# Patient Record
Sex: Female | Born: 1985 | Race: White | Hispanic: No | Marital: Married | Smoking: Never smoker
Health system: Southern US, Community
[De-identification: ages and names within clinical notes are randomized; demographics above are authoritative.]

## PROBLEM LIST (undated history)

## (undated) DIAGNOSIS — E039 Hypothyroidism, unspecified: Secondary | ICD-10-CM

## (undated) DIAGNOSIS — R112 Nausea with vomiting, unspecified: Secondary | ICD-10-CM

## (undated) DIAGNOSIS — Z9889 Other specified postprocedural states: Secondary | ICD-10-CM

## (undated) DIAGNOSIS — F32A Depression, unspecified: Secondary | ICD-10-CM

## (undated) DIAGNOSIS — K219 Gastro-esophageal reflux disease without esophagitis: Secondary | ICD-10-CM

## (undated) DIAGNOSIS — F419 Anxiety disorder, unspecified: Secondary | ICD-10-CM

## (undated) DIAGNOSIS — Z8742 Personal history of other diseases of the female genital tract: Secondary | ICD-10-CM

## (undated) HISTORY — PX: KNEE ARTHROSCOPY: SUR90

## (undated) HISTORY — PX: CYSTOURETHROSCOPY: SHX476

## (undated) HISTORY — PX: ABDOMINAL HYSTERECTOMY: SHX81

---

## 2006-12-19 ENCOUNTER — Ambulatory Visit: Payer: Self-pay | Admitting: Internal Medicine
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
## 2013-12-03 ENCOUNTER — Ambulatory Visit: Payer: Self-pay | Admitting: Family Medicine

## 2013-12-03 LAB — URINALYSIS, COMPLETE
BILIRUBIN, UR: NEGATIVE
BLOOD: NEGATIVE
GLUCOSE, UR: NEGATIVE
KETONE: NEGATIVE
LEUKOCYTE ESTERASE: NEGATIVE
Nitrite: NEGATIVE
Ph: 5.5 (ref 5.0–8.0)
Protein: NEGATIVE
SPECIFIC GRAVITY: 1.02 (ref 1.000–1.030)

## 2013-12-05 LAB — URINE CULTURE

## 2021-03-19 ENCOUNTER — Ambulatory Visit: Payer: Self-pay | Admitting: Family Medicine

## 2021-09-18 ENCOUNTER — Ambulatory Visit: Payer: Self-pay | Admitting: Surgery

## 2021-09-18 NOTE — H&P (Signed)
History of Present Illness: Cheryl Gentry is a 36 y.o. female who was referred to me for evaluation of abdominal pain. Since December of 2022, she has been having pain in the RUQ with radiation to her back. It is now almost constant, and gets worse after eating. She had a more severe episode about 2 weeks ago. She recently moved to the area from [place], [place]. She had a RUQ US done at [facility] in [place], which per report showed mild gallbladder wall thickening and stones within the gallbladder. We do not have the images available. She says she was told at that time that she need a cholecystectomy, however was not able to get the surgery approved electively as she does not have insurance. She has continued to have symptoms. She previously had LFTs done by her PCP which were normal.   Her only prior abdominal surgery is a hysterectomy.     Review of Systems: A complete review of systems was obtained from the patient.  I have reviewed this information and discussed as appropriate with the patient.  See HPI as well for other ROS.       Medical History: Past Medical History Past Medical History: Diagnosis Date  Allergic state     iodine shellfish  Anxiety    Hypertension    Thyroid disease        Patient Active Problem List Diagnosis  Thyroid disease     Past Surgical History Past Surgical History: Procedure Laterality Date  HYSTERECTOMY   2015  KNEE ARTHROSCOPY       both knees      Allergies Allergies Allergen Reactions  Iodine Hives  Shellfish Containing Products Hives and Swelling      Current Outpatient Medications on File Prior to Visit Medication Sig Dispense Refill  ALPRAZolam (XANAX) 0.25 MG tablet Take by mouth      levothyroxine (SYNTHROID, LEVOTHROID) 25 MCG tablet TAKE 1 TABLET BY MOUTH EVERY DAY ON AN EMPTY STOMACH. PT NEEDS APPT FOR FURTHER REFILLS. 30 tablet 0  metFORMIN (GLUCOPHAGE) 500 MG tablet TAKE 1 TABLET BY MOUTH TWICE DAILY AS DIRECTED       medroxyPROGESTERone (PROVERA) 10 MG tablet Take 10 mg by mouth once daily. Patient takes monthly for 10 days. (Patient not taking: Reported on 09/18/2021)      phentermine (ADIPEX-P) 37.5 MG capsule Take 37.5 mg by mouth 2 (two) times daily. (Patient not taking: Reported on 09/18/2021)       No current facility-administered medications on file prior to visit.     Family HistoryExpand by Default Family History Problem Relation Age of Onset  Skin cancer Mother    Obesity Mother    High blood pressure (Hypertension) Mother    Hyperlipidemia (Elevated cholesterol) Mother    Depression Mother    Skin cancer Father    Hyperlipidemia (Elevated cholesterol) Father    Deep vein thrombosis (DVT or abnormal blood clot formation) Father    High blood pressure (Hypertension) Father    Coronary Artery Disease (Blocked arteries around heart) Father 59  Obesity Sister    High blood pressure (Hypertension) Brother    Depression Brother    Bipolar disorder Brother    Depression Brother    High blood pressure (Hypertension) Brother    Thyroid disease Maternal Grandmother    Diabetes type II Maternal Grandfather    Stroke Paternal Grandmother 19  Colon cancer Paternal Grandmother    Coronary Artery Disease (Blocked arteries around heart) Paternal Grandfather 57  Social History   Tobacco Use Smoking Status Never Smokeless Tobacco Never     Social History Social History    Socioeconomic History  Marital status: Married Tobacco Use  Smoking status: Never  Smokeless tobacco: Never Substance and Sexual Activity  Alcohol use: No     Comment: rarely  Drug use: No  Sexual activity: Yes     Partners: Male     Birth control/protection: None Other Topics Concern  Would you please tell us about the people who live in your home, your pets, or anything else important to your social life? Yes     Comment: my husband mother in law 2 cats Social History Narrative   Married, lives with  husband. Some college. Works as Geologist, engineering at [employer]. Goes to [employer] five days a week. Thirty - 60 minutes a day there. STill pursuing a divinity degree - only has two classes left.       Objective:     Vitals:   09/18/21 1512 BP: 110/70 Pulse: 73 Weight: (!) 123.7 kg (272 lb 9.6 oz) Height: 163.8 cm (5' 4.5")   Body mass index is 46.07 kg/m.   Physical Exam Vitals reviewed.  Constitutional:      General: She is not in acute distress.    Appearance: Normal appearance.  HENT:     Head: Normocephalic and atraumatic.  Eyes:     General: No scleral icterus.    Conjunctiva/sclera: Conjunctivae normal.  Cardiovascular:     Rate and Rhythm: Normal rate and regular rhythm.     Heart sounds: No murmur heard. Pulmonary:     Effort: Pulmonary effort is normal. No respiratory distress.     Breath sounds: Normal breath sounds. No wheezing.  Abdominal:     General: There is no distension.     Palpations: Abdomen is soft.     Tenderness: There is no abdominal tenderness.     Comments: Mild RUQ tenderness to palpation. No upper abdominal surgical scars.  Musculoskeletal:        General: Normal range of motion.  Skin:    General: Skin is warm and dry.     Coloration: Skin is not jaundiced.  Neurological:     General: No focal deficit present.     Mental Status: She is alert and oriented to person, place, and time.  Psychiatric:        Mood and Affect: Mood normal.        Behavior: Behavior normal.        Thought Content: Thought content normal.          Assessment and Plan: Diagnoses and all orders for this visit:   Symptomatic cholelithiasis     This is a 36 yo female referred for evaluation of symptomatic cholelithiasis. Her symptoms are consistent with biliary colic, and she has documentation of gallstones on outside imaging. We have requested the actual images from [facility]. If unable to obtain, will need to repeat her [place] here in [place]. In  the meantime we will begin the scheduling process for surgery. Laparoscopic cholecystectomy was recommended. The details of this procedure were discussed with the patient, including the risks of bleeding, infection, bile leak, and <0.5% risk of common bile duct injury. The patient expressed understanding and agrees to proceed with surgery. She will be contacted to schedule an elective surgery date.  Sophronia Simas, MD [facility] General, Hepatobiliary and Pancreatic Surgery 09/18/21 3:54 PM

## 2021-10-16 ENCOUNTER — Encounter (HOSPITAL_COMMUNITY): Payer: Self-pay

## 2021-10-16 NOTE — Pre-Procedure Instructions (Signed)
Surgical Instructions    Your procedure is scheduled on Monday, October 9th.  Report to [facility] Main Entrance "A" at 05:30 A.M., then check in with the Admitting office.  Call this number if you have problems the morning of surgery:  [phone]   If you have any questions prior to your surgery date call [phone]: Open Monday-Friday 8am-4pm    Remember:  Do not eat after midnight the night before your surgery  You may drink clear liquids until 04:30 AM the morning of your surgery.   Clear liquids allowed are: Water, Non-Citrus Juices (without pulp), Carbonated Beverages, Clear Tea, Black Coffee Only (NO MILK, CREAM OR POWDERED CREAMER of any kind), and Gatorade.    Take these medicines the morning of surgery with A SIP OF WATER  escitalopram (LEXAPRO)  levothyroxine (SYNTHROID)  omeprazole (PRILOSEC OTC)    If needed: ALPRAZolam Prudy Feeler)  As of today, STOP taking any Aspirin (unless otherwise instructed by your surgeon) Aleve, Naproxen, Ibuprofen, Motrin, Advil, Goody's, BC's, all herbal medications, fish oil, and all vitamins.  WHAT DO I DO ABOUT MY DIABETES MEDICATION?   Do not take metFORMIN (GLUCOPHAGE)  the morning of surgery.    HOW TO MANAGE YOUR DIABETES BEFORE AND AFTER SURGERY  Why is it important to control my blood sugar before and after surgery? Improving blood sugar levels before and after surgery helps healing and can limit problems. A way of improving blood sugar control is eating a healthy diet by:  Eating less sugar and carbohydrates  Increasing activity/exercise  Talking with your doctor about reaching your blood sugar goals High blood sugars (greater than 180 mg/dL) can raise your risk of infections and slow your recovery, so you will need to focus on controlling your diabetes during the weeks before surgery. Make sure that the doctor who takes care of your diabetes knows about your planned surgery including the date and location.  How do I  manage my blood sugar before surgery? Check your blood sugar at least 4 times a day, starting 2 days before surgery, to make sure that the level is not too high or low.  Check your blood sugar the morning of your surgery when you wake up and every 2 hours until you get to the Short Stay unit.  If your blood sugar is less than 70 mg/dL, you will need to treat for low blood sugar: Do not take insulin. Treat a low blood sugar (less than 70 mg/dL) with  cup of clear juice (cranberry or apple), 4 glucose tablets, OR glucose gel. Recheck blood sugar in 15 minutes after treatment (to make sure it is greater than 70 mg/dL). If your blood sugar is not greater than 70 mg/dL on recheck, call [phone] for further instructions. Report your blood sugar to the short stay nurse when you get to Short Stay.  If you are admitted to the hospital after surgery: Your blood sugar will be checked by the staff and you will probably be given insulin after surgery (instead of oral diabetes medicines) to make sure you have good blood sugar levels. The goal for blood sugar control after surgery is 80-180 mg/dL.                     Do NOT Smoke (Tobacco/Vaping) for 24 hours prior to your procedure.  If you use a CPAP at night, you may bring your mask/headgear for your overnight stay.   Contacts, glasses, piercing's, hearing aid's, dentures or partials may  not be worn into surgery, please bring cases for these belongings.    For patients admitted to the hospital, discharge time will be determined by your treatment team.   Patients discharged the day of surgery will not be allowed to drive home, and someone needs to stay with them for 24 hours.  SURGICAL WAITING ROOM VISITATION Patients having surgery or a procedure may have no more than 2 support people in the waiting area - these visitors may rotate.   Children under the age of 40 must have an adult with them who is not the patient. If the patient needs to stay  at the hospital during part of their recovery, the visitor guidelines for inpatient rooms apply. Pre-op nurse will coordinate an appropriate time for 1 support person to accompany patient in pre-op.  This support person may not rotate.   Please refer to the [facility] website for the visitor guidelines for Inpatients (after your surgery is over and you are in a regular room).    Special instructions:   [place]- Preparing For Surgery  Before surgery, you can play an important role. Because skin is not sterile, your skin needs to be as free of germs as possible. You can reduce the number of germs on your skin by washing with CHG (chlorahexidine gluconate) Soap before surgery.  CHG is an antiseptic cleaner which kills germs and bonds with the skin to continue killing germs even after washing.    Oral Hygiene is also important to reduce your risk of infection.  Remember - BRUSH YOUR TEETH THE MORNING OF SURGERY WITH YOUR REGULAR TOOTHPASTE  Please do not use if you have an allergy to CHG or antibacterial soaps. If your skin becomes reddened/irritated stop using the CHG.  Do not shave (including legs and underarms) for at least 48 hours prior to first CHG shower. It is OK to shave your face.  Please follow these instructions carefully.   Shower the NIGHT BEFORE SURGERY and the MORNING OF SURGERY  If you chose to wash your hair, wash your hair first as usual with your normal shampoo.  After you shampoo, rinse your hair and body thoroughly to remove the shampoo.  Use CHG Soap as you would any other liquid soap. You can apply CHG directly to the skin and wash gently with a scrungie or a clean washcloth.   Apply the CHG Soap to your body ONLY FROM THE NECK DOWN.  Do not use on open wounds or open sores. Avoid contact with your eyes, ears, mouth and genitals (private parts). Wash Face and genitals (private parts)  with your normal soap.   Wash thoroughly, paying special attention to the area  where your surgery will be performed.  Thoroughly rinse your body with warm water from the neck down.  DO NOT shower/wash with your normal soap after using and rinsing off the CHG Soap.  Pat yourself dry with a CLEAN TOWEL.  Wear CLEAN PAJAMAS to bed the night before surgery  Place CLEAN SHEETS on your bed the night before your surgery  DO NOT SLEEP WITH PETS.   Day of Surgery: Take a shower with CHG soap. Do not wear jewelry or makeup Do not wear lotions, powders, perfumes, or deodorant. Do not shave 48 hours prior to surgery.   Do not bring valuables to the hospital. [facility] is not responsible for any belongings or valuables. Do not wear nail polish, gel polish, artificial nails, or any other type of covering on  natural nails (fingers and toes) If you have artificial nails or gel coating that need to be removed by a nail salon, please have this removed prior to surgery. Artificial nails or gel coating may interfere with anesthesia's ability to adequately monitor your vital signs. Wear Clean/Comfortable clothing the morning of surgery Remember to brush your teeth WITH YOUR REGULAR TOOTHPASTE.   Please read over the following fact sheets that you were given.    If you received a COVID test during your pre-op visit  it is requested that you wear a mask when out in public, stay away from anyone that may not be feeling well and notify your surgeon if you develop symptoms. If you have been in contact with anyone that has tested positive in the last 10 days please notify you surgeon.

## 2021-10-19 ENCOUNTER — Encounter (HOSPITAL_COMMUNITY)
Admission: RE | Admit: 2021-10-19 | Discharge: 2021-10-19 | Disposition: A | Discharge status: Home / Self Care | Payer: BC Managed Care – PPO | Source: Ambulatory Visit | Attending: Surgery | Admitting: Surgery

## 2021-10-19 ENCOUNTER — Other Ambulatory Visit: Payer: Self-pay

## 2021-10-19 ENCOUNTER — Encounter (HOSPITAL_COMMUNITY): Payer: Self-pay

## 2021-10-19 VITALS — BP 122/81 | HR 83 | Temp 98.6°F | Resp 17 | Ht 64.0 in | Wt 271.6 lb

## 2021-10-19 DIAGNOSIS — Z01818 Encounter for other preprocedural examination: Secondary | ICD-10-CM

## 2021-10-19 DIAGNOSIS — Z01812 Encounter for preprocedural laboratory examination: Secondary | ICD-10-CM | POA: Diagnosis present

## 2021-10-19 HISTORY — DX: Depression, unspecified: F32.A

## 2021-10-19 HISTORY — DX: Personal history of other diseases of the female genital tract: Z87.42

## 2021-10-19 HISTORY — DX: Other specified postprocedural states: Z98.890

## 2021-10-19 HISTORY — DX: Other specified postprocedural states: R11.2

## 2021-10-19 HISTORY — DX: Hypothyroidism, unspecified: E03.9

## 2021-10-19 HISTORY — DX: Anxiety disorder, unspecified: F41.9

## 2021-10-19 HISTORY — DX: Gastro-esophageal reflux disease without esophagitis: K21.9

## 2021-10-19 LAB — CBC
HCT: 38.5 % (ref 36.0–46.0)
Hemoglobin: 12.8 g/dL (ref 12.0–15.0)
MCH: 31.5 pg (ref 26.0–34.0)
MCHC: 33.2 g/dL (ref 30.0–36.0)
MCV: 94.8 fL (ref 80.0–100.0)
Platelets: 170 10*3/uL (ref 150–400)
RBC: 4.06 MIL/uL (ref 3.87–5.11)
RDW: 12.3 % (ref 11.5–15.5)
WBC: 4.6 10*3/uL (ref 4.0–10.5)
nRBC: 0 % (ref 0.0–0.2)

## 2021-10-19 NOTE — Progress Notes (Signed)
PCP - Verita Lamb, NP Cardiologist - denies  PPM/ICD - denies   Chest x-ray - 07/11/15 EKG - 07/10/15 Stress Test - denies ECHO - denies Cardiac Cath - denies  Sleep Study - denies   DM- denies  ASA/Blood Thinner Instructions: n/a  ERAS Protcol - yes, no drink   COVID TEST- n/a   Anesthesia review: no  Patient denies shortness of breath, fever, cough and chest pain at PAT appointment   All instructions explained to the patient, with a verbal understanding of the material. Patient agrees to go over the instructions while at home for a better understanding. The opportunity to ask questions was provided.

## 2021-10-25 NOTE — Anesthesia Preprocedure Evaluation (Signed)
Anesthesia Evaluation  Patient identified by MRN, date of birth, ID band Patient awake    Reviewed: Allergy & Precautions, NPO status , Patient's Chart, lab work & pertinent test results  History of Anesthesia Complications (+) PONV and history of anesthetic complications  Airway Mallampati: II       Dental no notable dental hx.    Pulmonary neg pulmonary ROS,    Pulmonary exam normal        Cardiovascular negative cardio ROS Normal cardiovascular exam     Neuro/Psych PSYCHIATRIC DISORDERS Anxiety Depression negative neurological ROS     GI/Hepatic GERD  Medicated,  Endo/Other  diabetes, Type 2, Oral Hypoglycemic AgentsHypothyroidism Morbid obesity  Renal/GU   negative genitourinary   Musculoskeletal negative musculoskeletal ROS (+)   Abdominal (+) + obese,   Peds negative pediatric ROS (+)  Hematology   Anesthesia Other Findings   Reproductive/Obstetrics                            Anesthesia Physical Anesthesia Plan  ASA: 3  Anesthesia Plan: General   Post-op Pain Management: Dilaudid IV   Induction: Intravenous  PONV Risk Score and Plan: 4 or greater and Ondansetron, Midazolam, Scopolamine patch - Pre-op, Promethazine, Amisulpride and Treatment may vary due to age or medical condition  Airway Management Planned: Oral ETT  Additional Equipment: None  Intra-op Plan:   Post-operative Plan: Extubation in OR  Informed Consent: I have reviewed the patients History and Physical, chart, labs and discussed the procedure including the risks, benefits and alternatives for the proposed anesthesia with the patient or authorized representative who has indicated his/her understanding and acceptance.     Dental advisory given  Plan Discussed with: CRNA  Anesthesia Plan Comments:        Anesthesia Quick Evaluation

## 2021-10-26 ENCOUNTER — Ambulatory Visit (HOSPITAL_COMMUNITY)
Admission: RE | Admit: 2021-10-26 | Discharge: 2021-10-26 | Disposition: A | Discharge status: Home / Self Care | Payer: BC Managed Care – PPO | Attending: Surgery | Admitting: Surgery

## 2021-10-26 ENCOUNTER — Encounter (HOSPITAL_COMMUNITY)
Admission: RE | Disposition: A | Discharge status: Home / Self Care | Payer: Self-pay | Source: Home / Self Care | Attending: Surgery

## 2021-10-26 ENCOUNTER — Encounter (HOSPITAL_COMMUNITY): Payer: Self-pay | Admitting: Surgery

## 2021-10-26 ENCOUNTER — Other Ambulatory Visit: Payer: Self-pay

## 2021-10-26 ENCOUNTER — Ambulatory Visit (HOSPITAL_COMMUNITY): Payer: BC Managed Care – PPO | Admitting: Anesthesiology

## 2021-10-26 DIAGNOSIS — F32A Depression, unspecified: Secondary | ICD-10-CM | POA: Insufficient documentation

## 2021-10-26 DIAGNOSIS — Z90710 Acquired absence of both cervix and uterus: Secondary | ICD-10-CM | POA: Insufficient documentation

## 2021-10-26 DIAGNOSIS — Z7984 Long term (current) use of oral hypoglycemic drugs: Secondary | ICD-10-CM | POA: Diagnosis not present

## 2021-10-26 DIAGNOSIS — K8010 Calculus of gallbladder with chronic cholecystitis without obstruction: Secondary | ICD-10-CM | POA: Diagnosis present

## 2021-10-26 DIAGNOSIS — F419 Anxiety disorder, unspecified: Secondary | ICD-10-CM | POA: Diagnosis not present

## 2021-10-26 DIAGNOSIS — K219 Gastro-esophageal reflux disease without esophagitis: Secondary | ICD-10-CM | POA: Diagnosis not present

## 2021-10-26 DIAGNOSIS — Z6841 Body Mass Index (BMI) 40.0 and over, adult: Secondary | ICD-10-CM | POA: Insufficient documentation

## 2021-10-26 DIAGNOSIS — E119 Type 2 diabetes mellitus without complications: Secondary | ICD-10-CM | POA: Diagnosis not present

## 2021-10-26 HISTORY — PX: CHOLECYSTECTOMY: SHX55

## 2021-10-26 SURGERY — LAPAROSCOPIC CHOLECYSTECTOMY
Anesthesia: General | Site: Abdomen

## 2021-10-26 MED ORDER — DEXAMETHASONE SODIUM PHOSPHATE 10 MG/ML IJ SOLN
INTRAMUSCULAR | Status: DC | PRN
  Administered 2021-10-26: 10 mg via INTRAVENOUS

## 2021-10-26 MED ORDER — OXYCODONE HCL 5 MG/5ML PO SOLN: 5.0000 mg | Freq: Once | ORAL | Status: DC | PRN

## 2021-10-26 MED ORDER — DEXAMETHASONE SODIUM PHOSPHATE 10 MG/ML IJ SOLN
INTRAMUSCULAR | Status: AC
  Filled 2021-10-26: qty 1

## 2021-10-26 MED ORDER — ONDANSETRON HCL 4 MG/2ML IJ SOLN
INTRAMUSCULAR | Status: DC | PRN
  Administered 2021-10-26: 4 mg via INTRAVENOUS

## 2021-10-26 MED ORDER — HYDROCODONE-ACETAMINOPHEN 5-325 MG PO TABS: 1.0000 | ORAL_TABLET | Freq: Four times a day (QID) | ORAL | 0 refills | Status: AC | PRN

## 2021-10-26 MED ORDER — MIDAZOLAM HCL 5 MG/5ML IJ SOLN
INTRAMUSCULAR | Status: DC | PRN
  Administered 2021-10-26: 2 mg via INTRAVENOUS

## 2021-10-26 MED ORDER — FENTANYL CITRATE (PF) 100 MCG/2ML IJ SOLN
INTRAMUSCULAR | Status: DC | PRN
  Administered 2021-10-26 (×4): 50 ug via INTRAVENOUS

## 2021-10-26 MED ORDER — ONDANSETRON HCL 4 MG/2ML IJ SOLN: 4.0000 mg | Freq: Once | INTRAMUSCULAR | Status: DC | PRN

## 2021-10-26 MED ORDER — KETOROLAC TROMETHAMINE 30 MG/ML IJ SOLN
30.0000 mg | Freq: Once | INTRAMUSCULAR | Status: AC | PRN
  Administered 2021-10-26: 30 mg via INTRAVENOUS

## 2021-10-26 MED ORDER — BUPIVACAINE-EPINEPHRINE 0.25% -1:200000 IJ SOLN
INTRAMUSCULAR | Status: DC | PRN
  Administered 2021-10-26: 25 mL

## 2021-10-26 MED ORDER — SUGAMMADEX SODIUM 500 MG/5ML IV SOLN
INTRAVENOUS | Status: AC
  Filled 2021-10-26: qty 5

## 2021-10-26 MED ORDER — ACETAMINOPHEN 160 MG/5ML PO SOLN: 325.0000 mg | ORAL | Status: DC | PRN

## 2021-10-26 MED ORDER — BUPIVACAINE-EPINEPHRINE (PF) 0.25% -1:200000 IJ SOLN
INTRAMUSCULAR | Status: AC
  Filled 2021-10-26: qty 30

## 2021-10-26 MED ORDER — KETOROLAC TROMETHAMINE 30 MG/ML IJ SOLN
INTRAMUSCULAR | Status: AC
  Filled 2021-10-26: qty 1

## 2021-10-26 MED ORDER — ACETAMINOPHEN 500 MG PO TABS
1000.0000 mg | ORAL_TABLET | ORAL | Status: AC
  Administered 2021-10-26: 1000 mg via ORAL
  Filled 2021-10-26: qty 2

## 2021-10-26 MED ORDER — ROCURONIUM BROMIDE 100 MG/10ML IV SOLN
INTRAVENOUS | Status: DC | PRN
  Administered 2021-10-26: 60 mg via INTRAVENOUS

## 2021-10-26 MED ORDER — ATROPINE SULFATE 0.4 MG/ML IV SOLN
INTRAVENOUS | Status: AC
  Filled 2021-10-26: qty 1

## 2021-10-26 MED ORDER — LIDOCAINE 2% (20 MG/ML) 5 ML SYRINGE
INTRAMUSCULAR | Status: AC
  Filled 2021-10-26: qty 5

## 2021-10-26 MED ORDER — MIDAZOLAM HCL 2 MG/2ML IJ SOLN
INTRAMUSCULAR | Status: AC
  Filled 2021-10-26: qty 2

## 2021-10-26 MED ORDER — SODIUM CHLORIDE 0.9 % IR SOLN
Status: DC | PRN
  Administered 2021-10-26: 1000 mL

## 2021-10-26 MED ORDER — ROCURONIUM BROMIDE 10 MG/ML (PF) SYRINGE
PREFILLED_SYRINGE | INTRAVENOUS | Status: AC
  Filled 2021-10-26: qty 10

## 2021-10-26 MED ORDER — PROPOFOL 10 MG/ML IV BOLUS
INTRAVENOUS | Status: AC
  Filled 2021-10-26: qty 20

## 2021-10-26 MED ORDER — LACTATED RINGERS IV SOLN: INTRAVENOUS | Status: DC

## 2021-10-26 MED ORDER — ACETAMINOPHEN 325 MG PO TABS: 325.0000 mg | ORAL_TABLET | ORAL | Status: DC | PRN

## 2021-10-26 MED ORDER — PROPOFOL 10 MG/ML IV BOLUS
INTRAVENOUS | Status: DC | PRN
  Administered 2021-10-26: 200 mg via INTRAVENOUS

## 2021-10-26 MED ORDER — FENTANYL CITRATE (PF) 250 MCG/5ML IJ SOLN
INTRAMUSCULAR | Status: AC
  Filled 2021-10-26: qty 5

## 2021-10-26 MED ORDER — ORAL CARE MOUTH RINSE: 15.0000 mL | Freq: Once | OROMUCOSAL | Status: AC

## 2021-10-26 MED ORDER — ONDANSETRON HCL 4 MG/2ML IJ SOLN
INTRAMUSCULAR | Status: AC
  Filled 2021-10-26: qty 2

## 2021-10-26 MED ORDER — BUPIVACAINE HCL (PF) 0.25 % IJ SOLN
INTRAMUSCULAR | Status: AC
  Filled 2021-10-26: qty 30

## 2021-10-26 MED ORDER — CEFAZOLIN IN SODIUM CHLORIDE 3-0.9 GM/100ML-% IV SOLN
3.0000 g | INTRAVENOUS | Status: AC
  Administered 2021-10-26: 3 g via INTRAVENOUS
  Filled 2021-10-26: qty 100

## 2021-10-26 MED ORDER — MEPERIDINE HCL 25 MG/ML IJ SOLN: 6.2500 mg | INTRAMUSCULAR | Status: DC | PRN

## 2021-10-26 MED ORDER — PHENYLEPHRINE HCL (PRESSORS) 10 MG/ML IV SOLN
INTRAVENOUS | Status: DC | PRN
  Administered 2021-10-26 (×2): 80 ug via INTRAVENOUS

## 2021-10-26 MED ORDER — CHLORHEXIDINE GLUCONATE 0.12 % MT SOLN
15.0000 mL | Freq: Once | OROMUCOSAL | Status: AC
  Administered 2021-10-26: 15 mL via OROMUCOSAL
  Filled 2021-10-26: qty 15

## 2021-10-26 MED ORDER — LIDOCAINE 2% (20 MG/ML) 5 ML SYRINGE
INTRAMUSCULAR | Status: DC | PRN
  Administered 2021-10-26: 100 mg via INTRAVENOUS

## 2021-10-26 MED ORDER — GABAPENTIN 300 MG PO CAPS
300.0000 mg | ORAL_CAPSULE | ORAL | Status: AC
  Administered 2021-10-26: 300 mg via ORAL
  Filled 2021-10-26: qty 1

## 2021-10-26 MED ORDER — PHENYLEPHRINE 80 MCG/ML (10ML) SYRINGE FOR IV PUSH (FOR BLOOD PRESSURE SUPPORT)
PREFILLED_SYRINGE | INTRAVENOUS | Status: AC
  Filled 2021-10-26: qty 10

## 2021-10-26 MED ORDER — OXYCODONE HCL 5 MG PO TABS: 5.0000 mg | ORAL_TABLET | Freq: Once | ORAL | Status: DC | PRN

## 2021-10-26 MED ORDER — SCOPOLAMINE 1 MG/3DAYS TD PT72
MEDICATED_PATCH | TRANSDERMAL | Status: DC | PRN
  Administered 2021-10-26: 1 via TRANSDERMAL

## 2021-10-26 MED ORDER — FENTANYL CITRATE (PF) 100 MCG/2ML IJ SOLN
INTRAMUSCULAR | Status: AC
  Filled 2021-10-26: qty 2

## 2021-10-26 MED ORDER — 0.9 % SODIUM CHLORIDE (POUR BTL) OPTIME
TOPICAL | Status: DC | PRN
  Administered 2021-10-26: 1000 mL

## 2021-10-26 MED ORDER — SCOPOLAMINE 1 MG/3DAYS TD PT72
MEDICATED_PATCH | TRANSDERMAL | Status: AC
  Filled 2021-10-26: qty 1

## 2021-10-26 MED ORDER — FENTANYL CITRATE (PF) 100 MCG/2ML IJ SOLN
25.0000 ug | INTRAMUSCULAR | Status: DC | PRN
  Administered 2021-10-26: 25 ug via INTRAVENOUS

## 2021-10-26 MED ORDER — SUGAMMADEX SODIUM 200 MG/2ML IV SOLN
INTRAVENOUS | Status: DC | PRN
  Administered 2021-10-26: 300 mg via INTRAVENOUS

## 2021-10-26 SURGICAL SUPPLY — 39 items
APPLIER CLIP 5 13 M/L LIGAMAX5 (MISCELLANEOUS) ×1
BLADE CLIPPER SURG (BLADE) IMPLANT
CANISTER SUCT 3000ML PPV (MISCELLANEOUS) ×1 IMPLANT
CHLORAPREP W/TINT 26 (MISCELLANEOUS) ×1 IMPLANT
CLIP APPLIE 5 13 M/L LIGAMAX5 (MISCELLANEOUS) ×1 IMPLANT
COVER SURGICAL LIGHT HANDLE (MISCELLANEOUS) ×1 IMPLANT
DERMABOND ADVANCED .7 DNX12 (GAUZE/BANDAGES/DRESSINGS) ×1 IMPLANT
ELECT REM PT RETURN 9FT ADLT (ELECTROSURGICAL) ×1
ELECTRODE REM PT RTRN 9FT ADLT (ELECTROSURGICAL) ×1 IMPLANT
GLOVE BIOGEL PI IND STRL 6 (GLOVE) ×1 IMPLANT
GLOVE BIOGEL PI MICRO STRL 5.5 (GLOVE) ×1 IMPLANT
GOWN STRL REUS W/ TWL LRG LVL3 (GOWN DISPOSABLE) ×3 IMPLANT
GOWN STRL REUS W/TWL LRG LVL3 (GOWN DISPOSABLE) ×3
GRASPER SUT TROCAR 14GX15 (MISCELLANEOUS) IMPLANT
KIT BASIN OR (CUSTOM PROCEDURE TRAY) ×1 IMPLANT
KIT TURNOVER KIT B (KITS) ×1 IMPLANT
L-HOOK LAP DISP 36CM (ELECTROSURGICAL) ×1
LHOOK LAP DISP 36CM (ELECTROSURGICAL) ×1 IMPLANT
NDL INSUFFLATION 14GA 120MM (NEEDLE) IMPLANT
NEEDLE INSUFFLATION 14GA 120MM (NEEDLE) ×1 IMPLANT
NS IRRIG 1000ML POUR BTL (IV SOLUTION) ×1 IMPLANT
PAD ARMBOARD 7.5X6 YLW CONV (MISCELLANEOUS) ×1 IMPLANT
PENCIL BUTTON HOLSTER BLD 10FT (ELECTRODE) ×1 IMPLANT
POUCH SPECIMEN RETRIEVAL 10MM (ENDOMECHANICALS) ×1 IMPLANT
SCISSORS LAP 5X35 DISP (ENDOMECHANICALS) ×1 IMPLANT
SET IRRIG TUBING LAPAROSCOPIC (IRRIGATION / IRRIGATOR) ×1 IMPLANT
SET TUBE SMOKE EVAC HIGH FLOW (TUBING) ×1 IMPLANT
SLEEVE ENDOPATH XCEL 5M (ENDOMECHANICALS) ×2 IMPLANT
SUT MNCRL AB 4-0 PS2 18 (SUTURE) ×1 IMPLANT
SUT VIC AB 3-0 SH 27 (SUTURE) ×1
SUT VIC AB 3-0 SH 27XBRD (SUTURE) IMPLANT
SUT VICRYL 0 UR6 27IN ABS (SUTURE) IMPLANT
TOWEL GREEN STERILE (TOWEL DISPOSABLE) ×1 IMPLANT
TOWEL GREEN STERILE FF (TOWEL DISPOSABLE) ×1 IMPLANT
TRAY LAPAROSCOPIC MC (CUSTOM PROCEDURE TRAY) ×1 IMPLANT
TROCAR XCEL 12X100 BLDLESS (ENDOMECHANICALS) IMPLANT
TROCAR Z-THREAD OPTICAL 5X100M (TROCAR) ×1 IMPLANT
WARMER LAPAROSCOPE (MISCELLANEOUS) ×1 IMPLANT
WATER STERILE IRR 1000ML POUR (IV SOLUTION) ×1 IMPLANT

## 2021-10-26 NOTE — Op Note (Signed)
Date: 10/26/21  Patient: Cheryl Gentry MRN: [id number]  Preoperative Diagnosis: Symptomatic cholelithiasis Postoperative Diagnosis: Same  Procedure: Laparoscopic cholecystectomy  Surgeon: Michaelle Birks, MD Assistant: Lossie Faes, MD (Resident)  EBL: Minimal  Anesthesia: General endotracheal  Specimens: Gallbladder  Indications: Cheryl Gentry is a 36 yo female who presented with frequent postprandial abdominal pain. RUQ US showed numerous stones within the gallbladder. After a discussion of the risks and benefits of surgery, she agreed to proceed with cholecystectomy.  Findings: Cholelithiasis with signs of chronic cholecystitis.  Procedure details: Informed consent was obtained in the preoperative area prior to the procedure. The patient was brought to the operating room and placed on the table in the supine position. General anesthesia was induced and appropriate lines and drains were placed for intraoperative monitoring. Perioperative antibiotics were administered per SCIP guidelines. The abdomen was prepped and draped in the usual sterile fashion. A pre-procedure timeout was taken verifying patient identity, surgical site and procedure to be performed.  A small infraumbilical skin incision was made, the subcutaneous tissue was divided with cautery, and the umbilical stalk was grasped and elevated. A Veress needle was inserted through the fascia and intraperitoneal placement was confirmed with the saline drop test. The abdomen was insufflated and a 54mm trocar was placed. The peritoneal cavity was inspected with no evidence of visceral or vascular injury. Three 79mm ports were placed in the right subcostal margin, all under direct visualization. The fundus of the gallbladder was grasped and retracted cephalad. There were some omental adhesions to the gallbladder, which were carefully taken down with cautery and  blunt dissection. The duodenum was near the infundibulum of the gallbladder, and  thin filmy adhesions were carefully divided with blunt dissection and cautery, taking great care not to injure the duodenum. The infundibulum was then retracted laterally. The cystic triangle was dissected out using cautery and blunt dissection. This was a somewhat tedious dissection as there was a stone in the neck of the gallbladder, and inflammatory adhesions within the cystic triangle consistent with chronic cholecystitis. The cystic artery and cystic duct were circumferentially dissected out, and the critical view of safety was obtained. The cystic duct and cystic artery were clipped and ligated, leaving two clips behind on the cystic duct stump. The gallbladder was taken off the liver using cautery, and the specimen was placed in an endocatch bag. The surgical site was irrigated with saline until the effluent was clear. Hemostasis was achieved in the gallbladder fossa using cautery. The cystic duct and artery stumps were visually inspected and there was no evidence of bile leak or bleeding. The duodenum was in tact with smooth serosa, with no signs of injury. The specimen was extracted via the umbilical port site. The umbilical port site fascia was closed with a 0 Vicryl figure-of-eight suture using a PMI. The remaining ports were removed and the abdomen was desufflated. The skin at all port sites was closed with 4-0 monocryl subcuticular suture. Dermabond was applied.  The patient tolerated the procedure well with no apparent complications. All counts were correct x2 at the end of the procedure. The patient was extubated and taken to PACU in stable condition.  Michaelle Birks, MD 10/26/21 8:57 AM

## 2021-10-26 NOTE — Anesthesia Postprocedure Evaluation (Signed)
Anesthesia Post Note  Patient: Cheryl Gentry  Procedure(s) Performed: LAPAROSCOPIC CHOLECYSTECTOMY (Abdomen)     Patient location during evaluation: PACU Anesthesia Type: General Level of consciousness: sedated Pain management: pain level controlled Vital Signs Assessment: post-procedure vital signs reviewed and stable Respiratory status: spontaneous breathing Cardiovascular status: stable Postop Assessment: no apparent nausea or vomiting Anesthetic complications: no   No notable events documented.  Last Vitals:  Vitals:   10/26/21 0955 10/26/21 1010  BP: (!) 108/58 (!) 105/55  Pulse: 69 70  Resp: 18 (!) 24  Temp:    SpO2: 93% 95%    Last Pain:  Vitals:   10/26/21 1010  TempSrc:   PainSc: [name]

## 2021-10-26 NOTE — Anesthesia Procedure Notes (Signed)
Procedure Name: Intubation Date/Time: 10/26/2021 7:35 AM  Performed by: Lavonia Dana, CRNAPre-anesthesia Checklist: Patient identified, Emergency Drugs available, Suction available and Patient being monitored Patient Re-evaluated:Patient Re-evaluated prior to induction Oxygen Delivery Method: Circle system utilized Preoxygenation: Pre-oxygenation with 100% oxygen Induction Type: IV induction Ventilation: Mask ventilation without difficulty and Oral airway inserted - appropriate to patient size Laryngoscope Size: Mac and 3 Grade View: Grade I Tube type: Oral Tube size: 7.0 mm Number of attempts: 1 Airway Equipment and Method: Stylet, Oral airway and Bite block Placement Confirmation: ETT inserted through vocal cords under direct vision, positive ETCO2 and breath sounds checked- equal and bilateral Secured at: 22 cm Tube secured with: Tape Dental Injury: Teeth and Oropharynx as per pre-operative assessment

## 2021-10-26 NOTE — Discharge Instructions (Addendum)
[facility] DISCHARGE INSTRUCTIONS  Activity No heavy lifting greater than 15 pounds for 4 weeks after surgery. Ok to shower in 24 hours, but do not bathe or submerge incisions underwater. Do not drive while taking narcotic pain medication.  Wound Care Your incisions are covered with skin glue called Dermabond. This will peel off on its own over time. You may shower and allow warm soapy water to run over your incisions. Gently pat dry. Do not submerge your incision underwater. Monitor your incision for any new redness, tenderness, or drainage.  When to Call us: Fever greater than 100.5 New redness, drainage, or swelling at incision site Severe pain, nausea, or vomiting Jaundice (yellowing of the whites of the eyes or skin)  Follow-up You have an appointment scheduled with Dr. Zenia Resides on November 10, 2021 at 9:30am. This will be at the [facility] Surgery office at [address] [place], [place], [place]. Please arrive at least 15 minutes prior to your scheduled appointment time.  For questions or concerns, please call the office at [phone].

## 2021-10-26 NOTE — H&P (Signed)
Cheryl Gentry is an 36 y.o. female.   Chief Complaint: abdominal pain HPI: Cheryl Gentry is a 36 y.o. female who was referred to me for evaluation of abdominal pain. Since December of 2022, she has been having pain in the RUQ with radiation to her back. It is now almost constant, and gets worse after eating. She had a more severe episode about 2 weeks ago. She recently moved to the area from [place], [place]. She had a RUQ US done at [facility] in [place], which showed mild gallbladder wall thickening and stones within the gallbladder. She says she was told at that time that she need a cholecystectomy, however was not able to get the surgery approved electively as she does not have insurance. She has continued to have symptoms. She previously had LFTs done by her PCP which were normal. She is here today for surgery.  Her only prior abdominal surgery is a hysterectomy.   Past Medical History:  Diagnosis Date   Anxiety    Depression    GERD (gastroesophageal reflux disease)    History of PCOS    Hypothyroidism    PONV (postoperative nausea and vomiting)     Past Surgical History:  Procedure Laterality Date   ABDOMINAL HYSTERECTOMY     CYSTOURETHROSCOPY     KNEE ARTHROSCOPY Bilateral     History reviewed. No pertinent family history. Social History:  reports that she has never smoked. She has never used smokeless tobacco. She reports that she does not currently use alcohol. She reports that she does not use drugs.  Allergies:  Allergies  Allergen Reactions   Shellfish Allergy Hives and Swelling   Iodine Hives and Swelling    Medications Prior to Admission  Medication Sig Dispense Refill   escitalopram (LEXAPRO) 20 MG tablet Take 20 mg by mouth daily.     levothyroxine (SYNTHROID) 100 MCG tablet Take 100 mcg by mouth daily.     metFORMIN (GLUCOPHAGE) 500 MG tablet Take 500 mg by mouth 2 (two) times daily.     omeprazole (PRILOSEC OTC) 20 MG tablet Take 20 mg by mouth daily.      ALPRAZolam (XANAX) 0.25 MG tablet Take 0.25 mg by mouth daily as needed for anxiety.      No results found for this or any previous visit (from the past 48 hour(s)). No results found.  Review of Systems  Blood pressure 114/74, pulse 86, temperature 99.2 F (37.3 C), temperature source Oral, resp. rate 18, height 5' 4.5" (1.638 m), weight 122.5 kg, SpO2 96 %. Physical Exam Vitals reviewed.  Constitutional:      General: She is not in acute distress.    Appearance: Normal appearance.  HENT:     Head: Normocephalic and atraumatic.  Eyes:     General: No scleral icterus.    Conjunctiva/sclera: Conjunctivae normal.  Pulmonary:     Effort: Pulmonary effort is normal. No respiratory distress.  Abdominal:     General: There is no distension.     Palpations: Abdomen is soft.  Skin:    General: Skin is warm and dry.     Coloration: Skin is not jaundiced.  Neurological:     General: No focal deficit present.     Mental Status: She is alert and oriented to person, place, and time.  Psychiatric:        Mood and Affect: Mood normal.        Behavior: Behavior normal.      Assessment/Plan 36 yo  female with symptomatic cholelithiasis. Proceed to OR for laparoscopic cholecystectomy. Informed consent obtained. Postop instructions and restrictions reviewed. Plan for discharge home postoperatively.  Fritzi Mandes, MD 10/26/2021, 7:05 AM

## 2021-10-26 NOTE — Transfer of Care (Signed)
Immediate Anesthesia Transfer of Care Note  Patient: Cheryl Gentry  Procedure(s) Performed: LAPAROSCOPIC CHOLECYSTECTOMY (Abdomen)  Patient Location: PACU  Anesthesia Type:General  Level of Consciousness: drowsy  Airway & Oxygen Therapy: Patient Spontanous Breathing and Patient connected to face mask oxygen  Post-op Assessment: Report given to RN and Post -op Vital signs reviewed and stable  Post vital signs: Reviewed and stable  Last Vitals:  Vitals Value Taken Time  BP 120/63 10/26/21 0906  Temp    Pulse 73 10/26/21 0909  Resp 28 10/26/21 0909  SpO2 94 % 10/26/21 0909  Vitals shown include unvalidated device data.  Last Pain:  Vitals:   10/26/21 0610  TempSrc:   PainSc: 2       Patients Stated Pain Goal: 2 (29/24/46 2863)  Complications: No notable events documented.

## 2021-10-27 ENCOUNTER — Encounter (HOSPITAL_COMMUNITY): Payer: Self-pay | Admitting: Surgery

## 2021-10-27 LAB — SURGICAL PATHOLOGY

## 2021-10-30 ENCOUNTER — Other Ambulatory Visit: Payer: Self-pay

## 2021-10-30 ENCOUNTER — Emergency Department (HOSPITAL_COMMUNITY)
Admission: EM | Admit: 2021-10-30 | Discharge: 2021-10-30 | Discharge status: Against Medical Advice | Payer: BC Managed Care – PPO

## 2021-10-30 NOTE — ED Notes (Signed)
Pt "tossed" pt stickers on EMT's desk and went to bathroom. Pt walked back past EMT and was asked what the stickers were and the pt was crying and spoke loudly and stated I don't want them, yall are not helping and pt left.

## 2021-10-31 ENCOUNTER — Emergency Department (HOSPITAL_COMMUNITY): Payer: BC Managed Care – PPO

## 2021-10-31 ENCOUNTER — Encounter (HOSPITAL_COMMUNITY): Payer: Self-pay | Admitting: Emergency Medicine

## 2021-10-31 ENCOUNTER — Other Ambulatory Visit: Payer: Self-pay

## 2021-10-31 ENCOUNTER — Inpatient Hospital Stay (HOSPITAL_COMMUNITY)
Admission: EM | Admit: 2021-10-31 | Discharge: 2021-11-04 | DRG: 193 | Disposition: A | Discharge status: Home / Self Care | Payer: BC Managed Care – PPO | Attending: Internal Medicine | Admitting: Internal Medicine

## 2021-10-31 DIAGNOSIS — Z91013 Allergy to seafood: Secondary | ICD-10-CM

## 2021-10-31 DIAGNOSIS — Z888 Allergy status to other drugs, medicaments and biological substances status: Secondary | ICD-10-CM

## 2021-10-31 DIAGNOSIS — Z7984 Long term (current) use of oral hypoglycemic drugs: Secondary | ICD-10-CM

## 2021-10-31 DIAGNOSIS — R7401 Elevation of levels of liver transaminase levels: Secondary | ICD-10-CM | POA: Diagnosis present

## 2021-10-31 DIAGNOSIS — E119 Type 2 diabetes mellitus without complications: Secondary | ICD-10-CM | POA: Diagnosis present

## 2021-10-31 DIAGNOSIS — Z79899 Other long term (current) drug therapy: Secondary | ICD-10-CM

## 2021-10-31 DIAGNOSIS — Z9889 Other specified postprocedural states: Secondary | ICD-10-CM

## 2021-10-31 DIAGNOSIS — E282 Polycystic ovarian syndrome: Secondary | ICD-10-CM | POA: Diagnosis present

## 2021-10-31 DIAGNOSIS — H1131 Conjunctival hemorrhage, right eye: Secondary | ICD-10-CM | POA: Diagnosis present

## 2021-10-31 DIAGNOSIS — Z79890 Hormone replacement therapy (postmenopausal): Secondary | ICD-10-CM

## 2021-10-31 DIAGNOSIS — J189 Pneumonia, unspecified organism: Principal | ICD-10-CM | POA: Diagnosis present

## 2021-10-31 DIAGNOSIS — J9601 Acute respiratory failure with hypoxia: Secondary | ICD-10-CM

## 2021-10-31 DIAGNOSIS — Z832 Family history of diseases of the blood and blood-forming organs and certain disorders involving the immune mechanism: Secondary | ICD-10-CM

## 2021-10-31 DIAGNOSIS — J9621 Acute and chronic respiratory failure with hypoxia: Secondary | ICD-10-CM | POA: Diagnosis present

## 2021-10-31 DIAGNOSIS — K219 Gastro-esophageal reflux disease without esophagitis: Secondary | ICD-10-CM | POA: Diagnosis present

## 2021-10-31 DIAGNOSIS — F32A Depression, unspecified: Secondary | ICD-10-CM | POA: Diagnosis present

## 2021-10-31 DIAGNOSIS — N3000 Acute cystitis without hematuria: Secondary | ICD-10-CM

## 2021-10-31 DIAGNOSIS — E876 Hypokalemia: Secondary | ICD-10-CM | POA: Diagnosis present

## 2021-10-31 DIAGNOSIS — N390 Urinary tract infection, site not specified: Secondary | ICD-10-CM

## 2021-10-31 DIAGNOSIS — I2693 Single subsegmental pulmonary embolism without acute cor pulmonale: Secondary | ICD-10-CM | POA: Diagnosis present

## 2021-10-31 DIAGNOSIS — Z1152 Encounter for screening for COVID-19: Secondary | ICD-10-CM

## 2021-10-31 DIAGNOSIS — F419 Anxiety disorder, unspecified: Secondary | ICD-10-CM | POA: Diagnosis present

## 2021-10-31 DIAGNOSIS — I2489 Other forms of acute ischemic heart disease: Secondary | ICD-10-CM | POA: Diagnosis present

## 2021-10-31 DIAGNOSIS — R079 Chest pain, unspecified: Secondary | ICD-10-CM

## 2021-10-31 DIAGNOSIS — Z6841 Body Mass Index (BMI) 40.0 and over, adult: Secondary | ICD-10-CM

## 2021-10-31 DIAGNOSIS — I2699 Other pulmonary embolism without acute cor pulmonale: Secondary | ICD-10-CM

## 2021-10-31 DIAGNOSIS — E039 Hypothyroidism, unspecified: Secondary | ICD-10-CM | POA: Diagnosis present

## 2021-10-31 LAB — URINALYSIS, ROUTINE W REFLEX MICROSCOPIC
Bilirubin Urine: NEGATIVE
Glucose, UA: NEGATIVE mg/dL
Ketones, ur: 5 mg/dL — AB
Nitrite: NEGATIVE
Protein, ur: NEGATIVE mg/dL
Specific Gravity, Urine: 1.008 (ref 1.005–1.030)
WBC, UA: >50 WBC/hpf — ABNORMAL HIGH (ref 0–5)
pH: 7 (ref 5.0–8.0)

## 2021-10-31 LAB — CBC WITH DIFFERENTIAL/PLATELET
Abs Immature Granulocytes: 0.05 10*3/uL (ref 0.00–0.07)
Basophils Absolute: 0 10*3/uL (ref 0.0–0.1)
Basophils Relative: 0 %
Eosinophils Absolute: 0.1 10*3/uL (ref 0.0–0.5)
Eosinophils Relative: 1 %
HCT: 38.8 % (ref 36.0–46.0)
Hemoglobin: 12.7 g/dL (ref 12.0–15.0)
Immature Granulocytes: 1 %
Lymphocytes Relative: 15 %
Lymphs Abs: 1.2 10*3/uL (ref 0.7–4.0)
MCH: 30.8 pg (ref 26.0–34.0)
MCHC: 32.7 g/dL (ref 30.0–36.0)
MCV: 93.9 fL (ref 80.0–100.0)
Monocytes Absolute: 0.7 10*3/uL (ref 0.1–1.0)
Monocytes Relative: 8 %
Neutro Abs: 5.9 10*3/uL (ref 1.7–7.7)
Neutrophils Relative %: 75 %
Platelets: 198 10*3/uL (ref 150–400)
RBC: 4.13 MIL/uL (ref 3.87–5.11)
RDW: 13.5 % (ref 11.5–15.5)
WBC: 7.9 10*3/uL (ref 4.0–10.5)
nRBC: 0 % (ref 0.0–0.2)

## 2021-10-31 LAB — MAGNESIUM: Magnesium: 2.1 mg/dL (ref 1.7–2.4)

## 2021-10-31 LAB — LIPASE, BLOOD: Lipase: 29 U/L (ref 11–51)

## 2021-10-31 LAB — COMPREHENSIVE METABOLIC PANEL
ALT: 143 U/L — ABNORMAL HIGH (ref 0–44)
AST: 103 U/L — ABNORMAL HIGH (ref 15–41)
Albumin: 3.6 g/dL (ref 3.5–5.0)
Alkaline Phosphatase: 118 U/L (ref 38–126)
Anion gap: 9 (ref 5–15)
BUN: 10 mg/dL (ref 6–20)
CO2: 24 mmol/L (ref 22–32)
Calcium: 8.9 mg/dL (ref 8.9–10.3)
Chloride: 104 mmol/L (ref 98–111)
Creatinine, Ser: 0.9 mg/dL (ref 0.44–1.00)
GFR, Estimated: >60 mL/min (ref >60)
Glucose, Bld: 121 mg/dL — ABNORMAL HIGH (ref 70–99)
Potassium: 3.3 mmol/L — ABNORMAL LOW (ref 3.5–5.1)
Sodium: 137 mmol/L (ref 135–145)
Total Bilirubin: 1.5 mg/dL — ABNORMAL HIGH (ref 0.3–1.2)
Total Protein: 7.5 g/dL (ref 6.5–8.1)

## 2021-10-31 LAB — TROPONIN I (HIGH SENSITIVITY)
Troponin I (High Sensitivity): 33 ng/L — ABNORMAL HIGH (ref <18)
Troponin I (High Sensitivity): 21 ng/L — ABNORMAL HIGH (ref <18)

## 2021-10-31 LAB — PROTIME-INR
INR: 1.1 (ref 0.8–1.2)
Prothrombin Time: 14.1 seconds (ref 11.4–15.2)

## 2021-10-31 LAB — APTT: aPTT: 28 seconds (ref 24–36)

## 2021-10-31 LAB — LACTIC ACID, PLASMA
Lactic Acid, Venous: 1 mmol/L (ref 0.5–1.9)
Lactic Acid, Venous: 1 mmol/L (ref 0.5–1.9)

## 2021-10-31 LAB — RESP PANEL BY RT-PCR (FLU A&B, COVID) ARPGX2
Influenza A by PCR: NEGATIVE
Influenza B by PCR: NEGATIVE
SARS Coronavirus 2 by RT PCR: NEGATIVE

## 2021-10-31 LAB — D-DIMER, QUANTITATIVE: D-Dimer, Quant: 5.06 ug/mL-FEU — ABNORMAL HIGH (ref 0.00–0.50)

## 2021-10-31 LAB — HCG, QUANTITATIVE, PREGNANCY: hCG, Beta Chain, Quant, S: <1 m[IU]/mL (ref <5)

## 2021-10-31 MED ORDER — LACTATED RINGERS IV BOLUS (SEPSIS)
1000.0000 mL | Freq: Once | INTRAVENOUS | Status: AC
  Administered 2021-10-31: 1000 mL via INTRAVENOUS

## 2021-10-31 MED ORDER — LACTATED RINGERS IV SOLN: INTRAVENOUS | Status: DC

## 2021-10-31 MED ORDER — LACTATED RINGERS IV BOLUS (SEPSIS): 1000.0000 mL | Freq: Once | INTRAVENOUS | Status: DC

## 2021-10-31 MED ORDER — POTASSIUM CHLORIDE CRYS ER 20 MEQ PO TBCR
40.0000 meq | EXTENDED_RELEASE_TABLET | Freq: Once | ORAL | Status: AC
  Administered 2021-10-31: 40 meq via ORAL
  Filled 2021-10-31: qty 2

## 2021-10-31 MED ORDER — DIPHENHYDRAMINE HCL 25 MG PO CAPS: 50.0000 mg | ORAL_CAPSULE | Freq: Once | ORAL | Status: AC

## 2021-10-31 MED ORDER — METHYLPREDNISOLONE SODIUM SUCC 40 MG IJ SOLR
40.0000 mg | Freq: Once | INTRAMUSCULAR | Status: AC
  Administered 2021-10-31: 40 mg via INTRAVENOUS
  Filled 2021-10-31: qty 1

## 2021-10-31 MED ORDER — SODIUM CHLORIDE 0.9 % IV SOLN
2.0000 g | Freq: Once | INTRAVENOUS | Status: AC
  Administered 2021-10-31: 2 g via INTRAVENOUS
  Filled 2021-10-31: qty 20

## 2021-10-31 MED ORDER — DIPHENHYDRAMINE HCL 50 MG/ML IJ SOLN
50.0000 mg | Freq: Once | INTRAMUSCULAR | Status: AC
  Administered 2021-10-31: 50 mg via INTRAVENOUS
  Filled 2021-10-31: qty 1

## 2021-10-31 MED ORDER — SODIUM CHLORIDE 0.9 % IV SOLN
500.0000 mg | INTRAVENOUS | Status: DC
  Administered 2021-10-31 – 2021-11-02 (×3): 500 mg via INTRAVENOUS
  Filled 2021-10-31 (×3): qty 5

## 2021-10-31 MED ORDER — IOHEXOL 350 MG/ML SOLN
100.0000 mL | Freq: Once | INTRAVENOUS | Status: AC | PRN
  Administered 2021-10-31: 100 mL via INTRAVENOUS

## 2021-10-31 MED ORDER — METRONIDAZOLE 500 MG/100ML IV SOLN
500.0000 mg | Freq: Once | INTRAVENOUS | Status: AC
  Administered 2021-10-31: 500 mg via INTRAVENOUS
  Filled 2021-10-31: qty 100

## 2021-10-31 NOTE — ED Provider Notes (Signed)
  Physical Exam  BP 134/86 (BP Location: Right Wrist)   Pulse 85   Temp 98 F (36.7 C) (Oral)   Resp 18   Ht 5\' 4"  (1.626 m)   Wt 122.5 kg   SpO2 98%   BMI 46.35 kg/m   Physical Exam  Procedures  Procedures  ED Course / MDM   Clinical Course as of 11/01/21 2150  Sat Oct 31, 2021  2209 Patient now requiring 4 L oxygen nasal cannula and saturating 95%. [CR]  2238 Consulted general hospital medicine Dr. Marlowe Sax regarding the patient.  She agreed with admission of the patient with the following of D-dimer for possible PE noted on CT angio chest. [CR]    Clinical Course User Index [CR] Wilnette Kales, PA   Medical Decision Making Amount and/or Complexity of Data Reviewed Labs: ordered. Radiology: ordered.  Risk Prescription drug management. Decision regarding hospitalization.   Patient care handed off from [facility], PA-C at shift change.  Disposition is to admit pending CT with contrast as patient had to be premedicated due to contrast allergy.  In short, patient is postop day 5 from lap cholecystectomy from chronic cholecystitis with complaints of shortness of breath, pleuritic chest pain, fever, cough, abdominal pain.  Cough is described as nonproductive in nature.  She states that 2 days after surgery, fever was noticed.  No melena or hematochezia noted.  She noticed some skin changes from adhesives or Band-Aids covering right cholecystectomy incisions.  No lower extremity swelling, no anticoagulation.  No history of PE/DVT.  She was given azithromycin and Tessalon Perles yesterday which did not help her symptoms.  Currently headache, neck pain, back pain, urinary/vaginal symptoms,.   Laboratory studies: No leukocytosis noted.  No evidence anemia.  Platelets within normal range.  Mild hypokalemia with a potassium of 3.3 which is supplemented orally while in the emergency department.  No renal dysfunction noted.  Mild transaminitis with an AST of 103 and ALT of 143  with total bilirubin of 1.5; CT abdomen pelvis ordered with possible follow-up right upper quadrant ultrasound.  aPTT within normal range.  PT/INR within normal range.  Blood cultures pending.  Lipase within normal range.  Initial troponin of 33 with repeat 21; EKG sinus rhythm without ischemic changes.   Imaging studies: Chest x-ray: Low lung volumes mild/subtle right basilar opacity which could be atelectasis versus early pneumonia. CT abdomen pelvis: Status postcholecystectomy.  No drainable fluid collection/abscess.  No free air. CT angio chest PE: Limited evaluation.  Possible subsegmental pulmonary embolism in the right lower lobe.  Left lower lobe pneumonia.    Medications: 2 L lactate Ringer's with broad-spectrum antibiotics of Rocephin, Flagyl and azithromycin.  Potassium given patient's hypokalemia.  Methylprednisolone as well as Benadryl given for pretreatment for IV contrast allergy.  Patient symptoms likely secondary to pneumonia plus or minus pulmonary embolism.  Hospital medicine was consulted regarding the patient along with D-dimer.  Recommendation was to follow-up on D-dimer for anticoagulation for possible subsegmental PE.  Broad-spectrum antibiotics were begun while emergency department with proper SIRS/sepsis protocol.  Patient deemed to meet admission criteria given new onset acute respiratory failure, evidence of pneumonia with possible PE and urinary tract infection.  Treatment plan was discussed at length with patient and family and they acknowledge understanding were agreeable to said plan.  Patient stable upon admission to the hospital.    Wilnette Kales, PA 11/01/21 2151    Godfrey Pick, MD 11/04/21 0230

## 2021-10-31 NOTE — Progress Notes (Signed)
Pt being followed by [facility] for Sepsis protocol. 

## 2021-10-31 NOTE — ED Provider Triage Note (Signed)
Emergency Medicine Provider Triage Evaluation Note  Cheryl Gentry , a 36 y.o. female  was evaluated in triage.  Pt complains of concerns.  Patient is 5 days out from cholecystectomy and has been having fevers as high as 102 for the past 2 days.  Also endorsing some abdominal pain but most bothersome symptom is her cough and shortness of breath.  Reports going to [facility] yesterday but left prior to being seen Review of Systems  Positive: Fever, cough, shortness of breath Negative:   Physical Exam  BP 121/67   Pulse 90   Temp 98.9 F (37.2 C) (Oral)   Resp (!) 22   SpO2 (!) 88%  Gen:   Awake, no distress   Resp:  Normal effort  MSK:   Moves extremities without difficulty  Other:  Diaphoretic on physical exam.  Mildly tender abdomen, lung sounds in upper lobes are clear, patient too uncomfortable to have lower lobes examined.  Sinus tach  Medical Decision Making  Medically screening exam initiated at 3:21 PM.  Appropriate orders placed.  Cheryl Gentry was informed that the remainder of the evaluation will be completed by another provider, this initial triage assessment does not replace that evaluation, and the importance of remaining in the ED until their evaluation is complete.  Code sepsis initiated due to patient's heart rate in the 110s, hypoxia and tachypnea.  Postop infection versus PE versus.  Patient does not wear oxygen at baseline.  She arrives satting 87% on room air.  Apparently 84% with EMS.  She is up to low 90s on 3 L   Cheryl Gentry A, PA-C 10/31/21 1523

## 2021-10-31 NOTE — ED Provider Notes (Signed)
[facility] DEPT Provider Note   CSN: [id number] Arrival date & time: 10/31/21  1503    History  Chief Complaint  Patient presents with   Post-op Problem    Cheryl Gentry is a 36 y.o. female history of anxiety, PCOS, depression, GERD   Who is POD 5 from lap chole for chronic cholecystitis with Dr. Michaelle Birks here for evaluation of fever, cough, shortness of breath, pleuritic chest pain and abdominal pain.  States 2 days after surgery she developed cough as well as fever.  Pain to right side of chest and upper abd. Cough not productive. She has been taking her home pain medications. CP is pleuritic in nature. She has had chronic diarrhea since the surgery.  No melena or bright red blood per rectum.  States she has sore over her port sites from using Band-Aids. States she has been eating and drinking without difficulty.  No lower extremity swelling.  No anticoagulation.  No history of PE or DVT.  States she feels overall all poor.  She is having hot and cold chills. States she did a telemedicine visit yesterday with outpatient provider who gave her azithromycin and Tessalon Perles which did not help. No HA, neck pain, back pain, dysuria. No drainage to port sites from surgery.  HPI    Home Medications Prior to Admission medications   Medication Sig Start Date End Date Taking? Authorizing Provider  ALPRAZolam Duanne Moron) 0.25 MG tablet Take 0.25 mg by mouth daily as needed for anxiety. 04/27/21  Yes [provider]  azithromycin (ZITHROMAX) 1 g powder Take 1 g by mouth daily. 5 days started 10/13 10/31/21  Yes [provider]  benzonatate (TESSALON) 100 MG capsule Take 100 mg by mouth 3 (three) times daily. 10/31/21  Yes [provider]  escitalopram (LEXAPRO) 20 MG tablet Take 20 mg by mouth daily. 05/11/21  Yes [provider]  HYDROcodone-acetaminophen (NORCO/VICODIN) 5-325 MG tablet Take 1 tablet by mouth every 6 (six)  hours as needed for up to 5 days for severe pain. 10/26/21 10/31/21 Yes Dwan Bolt, MD  levothyroxine (SYNTHROID) 100 MCG tablet Take 100 mcg by mouth daily. 07/08/21  Yes [provider]  metFORMIN (GLUCOPHAGE) 500 MG tablet Take 500 mg by mouth 2 (two) times daily. 07/08/21  Yes [provider]  omeprazole (PRILOSEC OTC) 20 MG tablet Take 20 mg by mouth daily.   Yes [provider]      Allergies    Shellfish allergy and Iodine    Review of Systems   Review of Systems  Constitutional:  Positive for activity change, diaphoresis and fever.  HENT: Negative.    Respiratory:  Positive for cough and shortness of breath. Negative for apnea, choking, chest tightness, wheezing and stridor.   Cardiovascular:  Positive for chest pain (right, pleuritic).  Gastrointestinal:  Positive for abdominal pain. Negative for abdominal distention, anal bleeding, blood in stool, constipation, diarrhea, nausea, rectal pain and vomiting.  Genitourinary: Negative.   Musculoskeletal: Negative.   Skin: Negative.   Neurological: Negative.   All other systems reviewed and are negative.   Physical Exam Updated Vital Signs BP 106/62   Pulse 88   Temp 98.9 F (37.2 C) (Oral)   Resp (!) 21   SpO2 96%  Physical Exam Vitals and nursing note reviewed.  Constitutional:      General: She is not in acute distress.    Appearance: She is well-developed. She is diaphoretic. She is not ill-appearing  or toxic-appearing.  HENT:     Head: Normocephalic and atraumatic.     Nose: Nose normal.     Mouth/Throat:     Mouth: Mucous membranes are moist.  Eyes:     Pupils: Pupils are equal, round, and reactive to light.  Cardiovascular:     Rate and Rhythm: Tachycardia present.     Comments: Intermittently tachycardic into the low 100s Pulmonary:     Effort: No respiratory distress.     Comments: Rhonchi lower lobes, active cough, speaks in full sentences without difficulty Abdominal:      General: Bowel sounds are normal. There is no distension.     Palpations: Abdomen is soft.     Tenderness: There is abdominal tenderness. There is no right CVA tenderness, left CVA tenderness, guarding or rebound.     Comments: Soft, minimal tenderness.  Port site without any drainage.  Scattered areas of erythematous blistering consistent with contact dermatitis from Band-Aids.  Dermabond overlying port sites  Musculoskeletal:        General: No swelling, tenderness, deformity or signs of injury. Normal range of motion.     Cervical back: Normal range of motion.     Right lower leg: No edema.     Left lower leg: No edema.     Comments: No bony tenderness, compartments soft  Skin:    General: Skin is warm.     Capillary Refill: Capillary refill takes less than 2 seconds.     Comments: No warmth, fluctuance or induration  Neurological:     General: No focal deficit present.     Mental Status: She is alert and oriented to person, place, and time.  Psychiatric:        Mood and Affect: Mood normal.     ED Results / Procedures / Treatments   Labs (all labs ordered are listed, but only abnormal results are displayed) Labs Reviewed  COMPREHENSIVE METABOLIC PANEL - Abnormal; Notable for the following components:      Result Value   Potassium 3.3 (*)    Glucose, Bld 121 (*)    AST 103 (*)    ALT 143 (*)    Total Bilirubin 1.5 (*)    All other components within normal limits  TROPONIN I (HIGH SENSITIVITY) - Abnormal; Notable for the following components:   Troponin I (High Sensitivity) 33 (*)    All other components within normal limits  RESP PANEL BY RT-PCR (FLU A&B, COVID) ARPGX2  CULTURE, BLOOD (ROUTINE X 2)  CULTURE, BLOOD (ROUTINE X 2)  LACTIC ACID, PLASMA  CBC WITH DIFFERENTIAL/PLATELET  PROTIME-INR  APTT  LIPASE, BLOOD  LACTIC ACID, PLASMA  URINALYSIS, ROUTINE W REFLEX MICROSCOPIC  HCG, QUANTITATIVE, PREGNANCY  TROPONIN I (HIGH SENSITIVITY)     EKG None  Radiology DG Chest Port 1 View  Result Date: 10/31/2021 CLINICAL DATA:  Questionable sepsis - evaluate for abnormality EXAM: PORTABLE CHEST 1 VIEW COMPARISON:  None Available. FINDINGS: Low lung volumes mild/subtle right basilar opacity. No visible pleural effusion or pneumothorax. Cardiomediastinal silhouette is within normal limits. No acute osseous abnormality. IMPRESSION: Low lung volumes mild/subtle right basilar opacity, which could represent atelectasis or early pneumonia. Dedicated PA and lateral radiographs could better assess if clinically warranted. Electronically Signed   By: Margaretha Sheffield M.D.   On: 10/31/2021 15:30    Procedures .Critical Care  Performed by: Nettie Elm, PA-C Authorized by: Nettie Elm, PA-C   Critical care provider statement:    Critical care  time (minutes):  45   Critical care was necessary to treat or prevent imminent or life-threatening deterioration of the following conditions:  Sepsis   Critical care was time spent personally by me on the following activities:  Development of treatment plan with patient or surrogate, discussions with consultants, evaluation of patient's response to treatment, examination of patient, ordering and review of laboratory studies, ordering and review of radiographic studies, ordering and performing treatments and interventions, pulse oximetry, re-evaluation of patient's condition and review of old charts     Medications Ordered in ED Medications  lactated ringers infusion (has no administration in time range)  metroNIDAZOLE (FLAGYL) IVPB 500 mg (has no administration in time range)  diphenhydrAMINE (BENADRYL) capsule 50 mg (has no administration in time range)    Or  diphenhydrAMINE (BENADRYL) injection 50 mg (has no administration in time range)  lactated ringers bolus 1,000 mL (1,000 mLs Intravenous New Bag/Given 10/31/21 1559)    And  lactated ringers bolus 1,000 mL (1,000 mLs  Intravenous New Bag/Given 10/31/21 1615)  cefTRIAXone (ROCEPHIN) 2 g in sodium chloride 0.9 % 100 mL IVPB (2 g Intravenous New Bag/Given 10/31/21 1559)  methylPREDNISolone sodium succinate (SOLU-MEDROL) 40 mg/mL injection 40 mg (40 mg Intravenous Given 10/31/21 1605)    ED Course/ Medical Decision Making/ A&P    36 year old history of PCOS, hypothyroid, depression, GERD who is 5 days postop lap chole with Dr. Michaelle Birks for chronic cholecystitis.  2 days postop developed fever, right-sided pleuritic chest pain, right abdominal pain.  Initial triage exam patient noted to be tachycardic, tachypneic and hypoxic.  Code sepsis called from triage.  She was started on IV fluids as well as antibiotics per triage provider.  Does have some coarse rhonchi on exam requiring 2 L via nasal cannula.  Her port sites appear to be well-healing she does have some contact dermatitis from Band-Aids however no obvious infectious source to her sites.  No clinical evidence of VTE on exam.  Concern for postop infection versus PNA versus PE.  Cannot tell if she had intraoperative cholangiogram. We will plan on labs, imaging   Labs and imaging personally viewed and interpreted:  CBC without leukocytosis, hemoglobin 16.0 Metabolic panel potassium 3.3, elevated LFTs, AST 103, ALT 143, T. bili 1.5, normal alk phos>> no prior to compare even after searching Care Everywhere. Lipase Troponin Lactic acid 1.0 Chest x-ray with right atelectasis versus early pneumonia  Plan for CTA chest to rule out PE, get better picture of lungs CT abdomen pelvis rule out postop infection such as obstructing stone, biloma, abscess.  Fortunately she does have contrast allergy to dye with urticaria.  No anaphylaxis.  Will give steroids, Benadryl pretreatment.  Patient reassessed.  Still getting IV antibiotics.  Still requiring 2 L via nasal cannula to maintain oxygen saturation in the mid 90s.  She is agreeable for further imaging and  work-up.  Care transferred to [facility] who will follow-up on imaging and disposition.  Suspect will need admission given hypoxia.                          Medical Decision Making Amount and/or Complexity of Data Reviewed Independent Historian: spouse External Data Reviewed: labs, radiology, ECG and notes. Labs: ordered. Decision-making details documented in ED Course. Radiology: ordered and independent interpretation performed. Decision-making details documented in ED Course. ECG/medicine tests: ordered and independent interpretation performed. Decision-making details documented in ED Course.  Risk OTC drugs. Prescription drug management.  Parenteral controlled substances. Decision regarding hospitalization. Diagnosis or treatment significantly limited by social determinants of health.          Final Clinical Impression(s) / ED Diagnoses Final diagnoses:  Acute respiratory failure with hypoxia ([place])  Right-sided chest pain  Recent major surgery  SIRS (systemic inflammatory response syndrome) ([place])    Rx / DC Orders ED Discharge Orders     None         Henderly, Britni A, PA-C 10/31/21 1747    Godfrey Pick, MD 11/04/21 0231

## 2021-10-31 NOTE — ED Triage Notes (Signed)
BIBA Per EMS: Pt coming home w/ c/o fever, diarrhea, cough, dizziness, SHOB since gallbladder removal Monday. 102.0 max temp. 101.4 temp today. 12pm tylenol.  Surgical site looks good.  [place] 84% RA  4L [place] 95%

## 2021-11-01 ENCOUNTER — Observation Stay (HOSPITAL_BASED_OUTPATIENT_CLINIC_OR_DEPARTMENT_OTHER): Payer: BC Managed Care – PPO

## 2021-11-01 DIAGNOSIS — F32A Depression, unspecified: Secondary | ICD-10-CM | POA: Diagnosis present

## 2021-11-01 DIAGNOSIS — E039 Hypothyroidism, unspecified: Secondary | ICD-10-CM

## 2021-11-01 DIAGNOSIS — J189 Pneumonia, unspecified organism: Secondary | ICD-10-CM

## 2021-11-01 DIAGNOSIS — F419 Anxiety disorder, unspecified: Secondary | ICD-10-CM | POA: Diagnosis present

## 2021-11-01 DIAGNOSIS — I2693 Single subsegmental pulmonary embolism without acute cor pulmonale: Secondary | ICD-10-CM | POA: Diagnosis present

## 2021-11-01 DIAGNOSIS — Z91013 Allergy to seafood: Secondary | ICD-10-CM | POA: Diagnosis not present

## 2021-11-01 DIAGNOSIS — Z79899 Other long term (current) drug therapy: Secondary | ICD-10-CM | POA: Diagnosis not present

## 2021-11-01 DIAGNOSIS — I2699 Other pulmonary embolism without acute cor pulmonale: Secondary | ICD-10-CM

## 2021-11-01 DIAGNOSIS — Z6841 Body Mass Index (BMI) 40.0 and over, adult: Secondary | ICD-10-CM | POA: Diagnosis not present

## 2021-11-01 DIAGNOSIS — N3000 Acute cystitis without hematuria: Secondary | ICD-10-CM | POA: Diagnosis not present

## 2021-11-01 DIAGNOSIS — J9601 Acute respiratory failure with hypoxia: Secondary | ICD-10-CM | POA: Diagnosis not present

## 2021-11-01 DIAGNOSIS — Z832 Family history of diseases of the blood and blood-forming organs and certain disorders involving the immune mechanism: Secondary | ICD-10-CM | POA: Diagnosis not present

## 2021-11-01 DIAGNOSIS — E119 Type 2 diabetes mellitus without complications: Secondary | ICD-10-CM

## 2021-11-01 DIAGNOSIS — E876 Hypokalemia: Secondary | ICD-10-CM

## 2021-11-01 DIAGNOSIS — R7401 Elevation of levels of liver transaminase levels: Secondary | ICD-10-CM | POA: Diagnosis present

## 2021-11-01 DIAGNOSIS — J9621 Acute and chronic respiratory failure with hypoxia: Secondary | ICD-10-CM | POA: Diagnosis present

## 2021-11-01 DIAGNOSIS — E282 Polycystic ovarian syndrome: Secondary | ICD-10-CM | POA: Diagnosis present

## 2021-11-01 DIAGNOSIS — H1131 Conjunctival hemorrhage, right eye: Secondary | ICD-10-CM | POA: Diagnosis present

## 2021-11-01 DIAGNOSIS — N390 Urinary tract infection, site not specified: Secondary | ICD-10-CM

## 2021-11-01 DIAGNOSIS — K219 Gastro-esophageal reflux disease without esophagitis: Secondary | ICD-10-CM | POA: Diagnosis present

## 2021-11-01 DIAGNOSIS — Z888 Allergy status to other drugs, medicaments and biological substances status: Secondary | ICD-10-CM | POA: Diagnosis not present

## 2021-11-01 DIAGNOSIS — Z1152 Encounter for screening for COVID-19: Secondary | ICD-10-CM | POA: Diagnosis not present

## 2021-11-01 DIAGNOSIS — Z7984 Long term (current) use of oral hypoglycemic drugs: Secondary | ICD-10-CM | POA: Diagnosis not present

## 2021-11-01 DIAGNOSIS — I2489 Other forms of acute ischemic heart disease: Secondary | ICD-10-CM | POA: Diagnosis present

## 2021-11-01 DIAGNOSIS — Z79890 Hormone replacement therapy (postmenopausal): Secondary | ICD-10-CM | POA: Diagnosis not present

## 2021-11-01 LAB — GLUCOSE, CAPILLARY
Glucose-Capillary: 128 mg/dL — ABNORMAL HIGH (ref 70–99)
Glucose-Capillary: 101 mg/dL — ABNORMAL HIGH (ref 70–99)
Glucose-Capillary: 102 mg/dL — ABNORMAL HIGH (ref 70–99)

## 2021-11-01 LAB — CBC
HCT: 32.7 % — ABNORMAL LOW (ref 36.0–46.0)
Hemoglobin: 10.5 g/dL — ABNORMAL LOW (ref 12.0–15.0)
MCH: 30.9 pg (ref 26.0–34.0)
MCHC: 32.1 g/dL (ref 30.0–36.0)
MCV: 96.2 fL (ref 80.0–100.0)
Platelets: 174 10*3/uL (ref 150–400)
RBC: 3.4 MIL/uL — ABNORMAL LOW (ref 3.87–5.11)
RDW: 13.6 % (ref 11.5–15.5)
WBC: 7 10*3/uL (ref 4.0–10.5)
nRBC: 0 % (ref 0.0–0.2)

## 2021-11-01 LAB — HEMOGLOBIN A1C
Hgb A1c MFr Bld: 5.1 % (ref 4.8–5.6)
Mean Plasma Glucose: 99.67 mg/dL

## 2021-11-01 LAB — COMPREHENSIVE METABOLIC PANEL
ALT: 110 U/L — ABNORMAL HIGH (ref 0–44)
AST: 66 U/L — ABNORMAL HIGH (ref 15–41)
Albumin: 2.9 g/dL — ABNORMAL LOW (ref 3.5–5.0)
Alkaline Phosphatase: 90 U/L (ref 38–126)
Anion gap: 8 (ref 5–15)
BUN: 11 mg/dL (ref 6–20)
CO2: 22 mmol/L (ref 22–32)
Calcium: 8.4 mg/dL — ABNORMAL LOW (ref 8.9–10.3)
Chloride: 107 mmol/L (ref 98–111)
Creatinine, Ser: 0.71 mg/dL (ref 0.44–1.00)
GFR, Estimated: >60 mL/min (ref >60)
Glucose, Bld: 122 mg/dL — ABNORMAL HIGH (ref 70–99)
Potassium: 3.8 mmol/L (ref 3.5–5.1)
Sodium: 137 mmol/L (ref 135–145)
Total Bilirubin: 0.9 mg/dL (ref 0.3–1.2)
Total Protein: 6.3 g/dL — ABNORMAL LOW (ref 6.5–8.1)

## 2021-11-01 LAB — HIV ANTIBODY (ROUTINE TESTING W REFLEX): HIV Screen 4th Generation wRfx: NONREACTIVE

## 2021-11-01 MED ORDER — PANTOPRAZOLE SODIUM 40 MG PO TBEC
40.0000 mg | DELAYED_RELEASE_TABLET | Freq: Every day | ORAL | Status: DC
  Administered 2021-11-01 – 2021-11-04 (×4): 40 mg via ORAL
  Filled 2021-11-01 (×4): qty 1

## 2021-11-01 MED ORDER — LEVOTHYROXINE SODIUM 100 MCG PO TABS
100.0000 ug | ORAL_TABLET | Freq: Every day | ORAL | Status: DC
  Administered 2021-11-01 – 2021-11-04 (×4): 100 ug via ORAL
  Filled 2021-11-01 (×4): qty 1

## 2021-11-01 MED ORDER — SODIUM CHLORIDE 0.9 % IV SOLN
1.0000 g | INTRAVENOUS | Status: AC
  Administered 2021-11-01 – 2021-11-04 (×4): 1 g via INTRAVENOUS
  Filled 2021-11-01 (×4): qty 10

## 2021-11-01 MED ORDER — ESCITALOPRAM OXALATE 20 MG PO TABS
20.0000 mg | ORAL_TABLET | Freq: Every day | ORAL | Status: DC
  Administered 2021-11-01 – 2021-11-04 (×4): 20 mg via ORAL
  Filled 2021-11-01 (×4): qty 1

## 2021-11-01 MED ORDER — OMEPRAZOLE MAGNESIUM 20 MG PO TBEC: 20.0000 mg | DELAYED_RELEASE_TABLET | Freq: Every day | ORAL | Status: DC

## 2021-11-01 MED ORDER — APIXABAN 5 MG PO TABS: 5.0000 mg | ORAL_TABLET | Freq: Two times a day (BID) | ORAL | Status: DC

## 2021-11-01 MED ORDER — METFORMIN HCL 500 MG PO TABS
500.0000 mg | ORAL_TABLET | Freq: Two times a day (BID) | ORAL | Status: DC
  Administered 2021-11-03 – 2021-11-04 (×3): 500 mg via ORAL
  Filled 2021-11-01 (×3): qty 1

## 2021-11-01 MED ORDER — APIXABAN 5 MG PO TABS
10.0000 mg | ORAL_TABLET | Freq: Two times a day (BID) | ORAL | Status: DC
  Administered 2021-11-01 – 2021-11-04 (×8): 10 mg via ORAL
  Filled 2021-11-01 (×8): qty 2

## 2021-11-01 MED ORDER — ALPRAZOLAM 0.25 MG PO TABS: 0.2500 mg | ORAL_TABLET | Freq: Every day | ORAL | Status: DC | PRN

## 2021-11-01 MED ORDER — GUAIFENESIN-DM 100-10 MG/5ML PO SYRP
5.0000 mL | ORAL_SOLUTION | ORAL | Status: DC | PRN
  Administered 2021-11-01 – 2021-11-03 (×3): 5 mL via ORAL
  Filled 2021-11-01 (×3): qty 5

## 2021-11-01 MED ORDER — INSULIN ASPART 100 UNIT/ML IJ SOLN
0.0000 [IU] | Freq: Three times a day (TID) | INTRAMUSCULAR | Status: DC
  Filled 2021-11-01: qty 0.09

## 2021-11-01 MED ORDER — INSULIN ASPART 100 UNIT/ML IJ SOLN
0.0000 [IU] | Freq: Every day | INTRAMUSCULAR | Status: DC
  Filled 2021-11-01: qty 0.05

## 2021-11-01 NOTE — H&P (Signed)
History and Physical    Cheryl Gentry VZD:[id number] DOB: 14-Mar-1985 DOA: 10/31/2021  PCP: Verita Lamb, NP  Patient coming from: Home  Chief Complaint: Fever  HPI: Cheryl Gentry is a 36 y.o. female with medical history significant of anxiety/depression, GERD, hypothyroidism, class III obesity, PCOS, type 2 diabetes, cholelithiasis/cholecystitis status post recent laparoscopic cholecystectomy on 10/26/2021 presented to the ED with complaints of fever, cough, shortness of breath, pleuritic chest pain, and abdominal pain.  SPO2 84% on room air, placed on 4 L O2.  Afebrile.  Labs showing no leukocytosis or anemia, potassium 3.3, AST 103, ALT 143, alk phos 118, T. bili 1.5, lipase normal, lactic acid normal x2, blood cultures drawn, troponin 33> 21, COVID and influenza PCR negative, beta-hCG negative, D-dimer 5.06.  UA with large amount of leukocytes and microscopy showing >50 WBCs.  CTA chest showing possible tiny isolated subsegmental PE in the right lower lobe and also showing evidence of left lower lobe pneumonia.  CT abdomen pelvis showing no postop complication. Patient was given pretreatment for IV contrast with Benadryl and Solu-Medrol.  She was also given oral potassium 40 mEq, ceftriaxone, azithromycin, metronidazole, and 2 L LR boluses.  TR[facility] called to admit.  Patient states she has continued to have fevers since after her surgery.  Initially it was low-grade fevers with temperature 99 F but for the past few days she has had temperature readings as high as 102 F.  Also endorsing cough, shortness of breath, and right-sided pleuritic chest pain.  She had 2 episodes of vomiting over the past few days which she thinks happened after she coughed aggressively.  She is no longer vomiting and is requesting food.  Denies abdominal pain.  Endorsing dysuria/urinary frequency and urgency.  Denies personal history of blood clots but does report family history of clotting disorder (both father and  sister).  Review of Systems:  Review of Systems  All other systems reviewed and are negative.   Past Medical History:  Diagnosis Date   Anxiety    Depression    GERD (gastroesophageal reflux disease)    History of PCOS    Hypothyroidism    PONV (postoperative nausea and vomiting)     Past Surgical History:  Procedure Laterality Date   ABDOMINAL HYSTERECTOMY     CHOLECYSTECTOMY N/A 10/26/2021   Procedure: LAPAROSCOPIC CHOLECYSTECTOMY;  Surgeon: Dwan Bolt, MD;  Location: [place];  Service: General;  Laterality: N/A;   CYSTOURETHROSCOPY     KNEE ARTHROSCOPY Bilateral      reports that she has never smoked. She has never used smokeless tobacco. She reports that she does not currently use alcohol. She reports that she does not use drugs.  Allergies  Allergen Reactions   Shellfish Allergy Hives and Swelling   Iodine Hives and Swelling    History reviewed. No pertinent family history.  Prior to Admission medications   Medication Sig Start Date End Date Taking? Authorizing Provider  ALPRAZolam Duanne Moron) 0.25 MG tablet Take 0.25 mg by mouth daily as needed for anxiety. 04/27/21  Yes [provider]  azithromycin (ZITHROMAX) 1 g powder Take 1 g by mouth daily. 5 days started 10/13 10/31/21  Yes [provider]  benzonatate (TESSALON) 100 MG capsule Take 100 mg by mouth 3 (three) times daily. 10/31/21  Yes [provider]  escitalopram (LEXAPRO) 20 MG tablet Take 20 mg by mouth daily. 05/11/21  Yes [provider]  levothyroxine (SYNTHROID) 100 MCG tablet Take 100 mcg by mouth daily.  07/08/21  Yes [provider]  metFORMIN (GLUCOPHAGE) 500 MG tablet Take 500 mg by mouth 2 (two) times daily. 07/08/21  Yes [provider]  omeprazole (PRILOSEC OTC) 20 MG tablet Take 20 mg by mouth daily.   Yes [provider]    Physical Exam: Vitals:   10/31/21 2230 10/31/21 2245 10/31/21 2300 10/31/21 2318  BP:    116/68  Pulse: 89  95 96 97  Resp: (!) 26 (!) 25 (!) 25 (!) 21  Temp:      TempSrc:      SpO2: 94% 94% 92% 94%  Weight:      Height:        Physical Exam Vitals reviewed.  Constitutional:      General: She is not in acute distress. HENT:     Head: Normocephalic and atraumatic.  Eyes:     Extraocular Movements: Extraocular movements intact.  Cardiovascular:     Rate and Rhythm: Normal rate and regular rhythm.     Pulses: Normal pulses.  Pulmonary:     Effort: Pulmonary effort is normal. No respiratory distress.     Breath sounds: Rhonchi present.  Abdominal:     General: Bowel sounds are normal. There is no distension.     Palpations: Abdomen is soft.     Tenderness: There is no abdominal tenderness.     Comments: Surgical incision sites healing well without obvious signs of infection  Musculoskeletal:        General: No swelling or tenderness.     Cervical back: Normal range of motion.  Skin:    General: Skin is warm and dry.  Neurological:     General: No focal deficit present.     Mental Status: She is alert and oriented to person, place, and time.     Labs on Admission: I have personally reviewed following labs and imaging studies  CBC: Recent Labs  Lab 10/31/21 1537  WBC 7.9  NEUTROABS 5.9  HGB 12.7  HCT 38.8  MCV 93.9  PLT 952   Basic Metabolic Panel: Recent Labs  Lab 10/31/21 1537 10/31/21 2030  NA 137  --   K 3.3*  --   CL 104  --   CO2 24  --   GLUCOSE 121*  --   BUN 10  --   CREATININE 0.90  --   CALCIUM 8.9  --   MG  --  2.1   GFR: Estimated Creatinine Clearance: 110.5 mL/min (by C-G formula based on SCr of 0.9 mg/dL). Liver Function Tests: Recent Labs  Lab 10/31/21 1537  AST 103*  ALT 143*  ALKPHOS 118  BILITOT 1.5*  PROT 7.5  ALBUMIN 3.6   Recent Labs  Lab 10/31/21 1710  LIPASE 29   No results for input(s): "AMMONIA" in the last 168 hours. Coagulation Profile: Recent Labs  Lab 10/31/21 1537  INR 1.1   Cardiac Enzymes: No results  for input(s): "CKTOTAL", "CKMB", "CKMBINDEX", "TROPONINI" in the last 168 hours. BNP (last 3 results) No results for input(s): "PROBNP" in the last 8760 hours. HbA1C: No results for input(s): "HGBA1C" in the last 72 hours. CBG: No results for input(s): "GLUCAP" in the last 168 hours. Lipid Profile: No results for input(s): "CHOL", "HDL", "LDLCALC", "TRIG", "CHOLHDL", "LDLDIRECT" in the last 72 hours. Thyroid Function Tests: No results for input(s): "TSH", "T4TOTAL", "FREET4", "T3FREE", "THYROIDAB" in the last 72 hours. Anemia Panel: No results for input(s): "VITAMINB12", "FOLATE", "FERRITIN", "TIBC", "IRON", "RETICCTPCT" in the last 72  hours. Urine analysis:    Component Value Date/Time   COLORURINE YELLOW 10/31/2021 2038   APPEARANCEUR HAZY (A) 10/31/2021 2038   APPEARANCEUR CLEAR 12/03/2013 1950   LABSPEC 1.008 10/31/2021 2038   LABSPEC 1.020 12/03/2013 1950   PHURINE 7.0 10/31/2021 2038   GLUCOSEU NEGATIVE 10/31/2021 2038   GLUCOSEU NEGATIVE 12/03/2013 1950   HGBUR SMALL (A) 10/31/2021 2038   BILIRUBINUR NEGATIVE 10/31/2021 2038   [place] NEGATIVE 12/03/2013 1950   KETONESUR 5 (A) 10/31/2021 2038   PROTEINUR NEGATIVE 10/31/2021 2038   NITRITE NEGATIVE 10/31/2021 2038   LEUKOCYTESUR LARGE (A) 10/31/2021 2038   LEUKOCYTESUR NEGATIVE 12/03/2013 1950    Radiological Exams on Admission: CT Angio Chest PE W and/or Wo Contrast  Result Date: 10/31/2021 CLINICAL DATA:  Five days status post cholecystectomy, fevers, abdominal pain. Evaluate for PE. EXAM: CT ANGIOGRAPHY CHEST CT ABDOMEN AND PELVIS WITH CONTRAST TECHNIQUE: Multidetector CT imaging of the chest was performed using the standard protocol during bolus administration of intravenous contrast. Multiplanar CT image reconstructions and MIPs were obtained to evaluate the vascular anatomy. Multidetector CT imaging of the abdomen and pelvis was performed using the standard protocol during bolus administration of intravenous  contrast. RADIATION DOSE REDUCTION: This exam was performed according to the departmental dose-optimization program which includes automated exposure control, adjustment of the mA and/or kV according to patient size and/or use of iterative reconstruction technique. CONTRAST:  163m OMNIPAQUE IOHEXOL 350 MG/ML SOLN COMPARISON:  Chest radiograph dated 10/31/2021 FINDINGS: CTA CHEST FINDINGS Cardiovascular: Limited evaluation of the bilateral pulmonary arteries due to bolus timing and motion degradation. Satisfactory opacification to the lobar level. Possible tiny subsegmental pulmonary embolism in the medial right lower lobe (series 5/image 126 and 132), although equivocal due to motion degradation. If this reflects a true finding, the clot burden is very small. Although not tailored for evaluation of the thoracic aorta, there is no evidence of thoracic aortic aneurysm or dissection. The heart is normal in size.  No pericardial effusion. Mediastinum/Nodes: No suspicious mediastinal lymphadenopathy. Visualized thyroid is unremarkable. Lungs/Pleura: Evaluation lung parenchyma is constrained by respiratory motion. Within that constraint, there are no suspicious pulmonary nodules. Patchy medial left lower lobe opacity (series 10/image 87), suspicious for pneumonia. No pleural effusion or pneumothorax. Musculoskeletal: Mild degenerative changes of the lower thoracic spine. Review of the MIP images confirms the above findings. CT ABDOMEN and PELVIS FINDINGS Hepatobiliary: Liver is within normal limits. Status post cholecystectomy. No intrahepatic or extrahepatic ductal dilatation. Pancreas: Within normal limits. Spleen: Within normal limits. Adrenals/Urinary Tract: Adrenal glands are within normal limits. Kidneys are within normal limits.  No hydronephrosis. Bladder is within normal limits. Stomach/Bowel: Stomach is within normal limits. No evidence of bowel obstruction. Normal appendix (series 2/image 85). No colonic wall  thickening or inflammatory changes. Vascular/Lymphatic: No evidence of abdominal aortic aneurysm. No suspicious abdominopelvic lymphadenopathy. Reproductive: Status post hysterectomy. Bilateral ovaries are within normal limits. Other: No abdominopelvic ascites. No drainable fluid collection/abscess.  No free air. Musculoskeletal: Visualized osseous structures are within normal limits. Review of the MIP images confirms the above findings. IMPRESSION: Limited evaluation. Possible tiny isolated subsegmental pulmonary embolism in the right lower lobe, equivocal due to motion degradation. If this reflects a true finding, the clot burden is very small. Left lower lobe pneumonia. Status post cholecystectomy. No drainable fluid collection/abscess. No free air. Electronically Signed   By: SJulian HyM.D.   On: 10/31/2021 21:34   CT ABDOMEN PELVIS W CONTRAST  Result Date: 10/31/2021 CLINICAL DATA:  Five  days status post cholecystectomy, fevers, abdominal pain. Evaluate for PE. EXAM: CT ANGIOGRAPHY CHEST CT ABDOMEN AND PELVIS WITH CONTRAST TECHNIQUE: Multidetector CT imaging of the chest was performed using the standard protocol during bolus administration of intravenous contrast. Multiplanar CT image reconstructions and MIPs were obtained to evaluate the vascular anatomy. Multidetector CT imaging of the abdomen and pelvis was performed using the standard protocol during bolus administration of intravenous contrast. RADIATION DOSE REDUCTION: This exam was performed according to the departmental dose-optimization program which includes automated exposure control, adjustment of the mA and/or kV according to patient size and/or use of iterative reconstruction technique. CONTRAST:  134m OMNIPAQUE IOHEXOL 350 MG/ML SOLN COMPARISON:  Chest radiograph dated 10/31/2021 FINDINGS: CTA CHEST FINDINGS Cardiovascular: Limited evaluation of the bilateral pulmonary arteries due to bolus timing and motion degradation.  Satisfactory opacification to the lobar level. Possible tiny subsegmental pulmonary embolism in the medial right lower lobe (series 5/image 126 and 132), although equivocal due to motion degradation. If this reflects a true finding, the clot burden is very small. Although not tailored for evaluation of the thoracic aorta, there is no evidence of thoracic aortic aneurysm or dissection. The heart is normal in size.  No pericardial effusion. Mediastinum/Nodes: No suspicious mediastinal lymphadenopathy. Visualized thyroid is unremarkable. Lungs/Pleura: Evaluation lung parenchyma is constrained by respiratory motion. Within that constraint, there are no suspicious pulmonary nodules. Patchy medial left lower lobe opacity (series 10/image 87), suspicious for pneumonia. No pleural effusion or pneumothorax. Musculoskeletal: Mild degenerative changes of the lower thoracic spine. Review of the MIP images confirms the above findings. CT ABDOMEN and PELVIS FINDINGS Hepatobiliary: Liver is within normal limits. Status post cholecystectomy. No intrahepatic or extrahepatic ductal dilatation. Pancreas: Within normal limits. Spleen: Within normal limits. Adrenals/Urinary Tract: Adrenal glands are within normal limits. Kidneys are within normal limits.  No hydronephrosis. Bladder is within normal limits. Stomach/Bowel: Stomach is within normal limits. No evidence of bowel obstruction. Normal appendix (series 2/image 85). No colonic wall thickening or inflammatory changes. Vascular/Lymphatic: No evidence of abdominal aortic aneurysm. No suspicious abdominopelvic lymphadenopathy. Reproductive: Status post hysterectomy. Bilateral ovaries are within normal limits. Other: No abdominopelvic ascites. No drainable fluid collection/abscess.  No free air. Musculoskeletal: Visualized osseous structures are within normal limits. Review of the MIP images confirms the above findings. IMPRESSION: Limited evaluation. Possible tiny isolated  subsegmental pulmonary embolism in the right lower lobe, equivocal due to motion degradation. If this reflects a true finding, the clot burden is very small. Left lower lobe pneumonia. Status post cholecystectomy. No drainable fluid collection/abscess. No free air. Electronically Signed   By: SJulian HyM.D.   On: 10/31/2021 21:34   DG Chest Port 1 View  Result Date: 10/31/2021 CLINICAL DATA:  Questionable sepsis - evaluate for abnormality EXAM: PORTABLE CHEST 1 VIEW COMPARISON:  None Available. FINDINGS: Low lung volumes mild/subtle right basilar opacity. No visible pleural effusion or pneumothorax. Cardiomediastinal silhouette is within normal limits. No acute osseous abnormality. IMPRESSION: Low lung volumes mild/subtle right basilar opacity, which could represent atelectasis or early pneumonia. Dedicated PA and lateral radiographs could better assess if clinically warranted. Electronically Signed   By: FMargaretha SheffieldM.D.   On: 10/31/2021 15:30    EKG: Independently reviewed.  Sinus rhythm, no prior tracing for comparison.  Assessment and Plan  Community-acquired pneumonia CT showing evidence of left lower lobe pneumonia.  No signs of sepsis at this time.  COVID and influenza PCR negative. -Continue ceftriaxone and azithromycin -Antitussive as needed -Blood cultures pending  Acute right lower lobe subsegmental PE CTA showing possible tiny isolated subsegmental PE in the right lower lobe.  Patient is endorsing right-sided pleuritic chest pain and D-dimer is elevated.  Risk factors include family history, class III obesity, and recent surgery. -Start Eliquis -Bilateral lower extremity Dopplers -Work-up ordered to assess for underlying clotting disorder given strong family history  Acute hypoxemic respiratory failure Likely due to conditions listed above.  SPO2 84% on room air, currently satting well on 4 L O2. -Continue supplemental oxygen, wean as tolerated  UTI Patient is  endorsing urinary symptoms.  UA with large amount of leukocytes and microscopy showing >50 WBCs.  No signs of sepsis. -On ceftriaxone -Urine culture  Mild hypokalemia -Potassium replacement given, continue to monitor -Magnesium is within normal range  Mild transaminitis Status post recent laparoscopic cholecystectomy and CT negative for acute hepatobiliary abnormality. -Continue to monitor  Mild troponin elevation Likely due to demand ischemia.  Troponin mildly elevated but stable and not consistent with ACS.  Anxiety/depression -Continue Lexapro, Xanax prn  GERD -Continue omeprazole  Hypothyroidism -Continue Synthroid  Type 2 diabetes -Check A1c -Sensitive sliding scale insulin ACHS -Hold metformin  DVT prophylaxis: Eliquis Code Status: Full Code Family Communication: No family at bedside. Level of Gentry: Telemetry bed Admission status: It is my clinical opinion that referral for OBSERVATION is reasonable and necessary in this patient based on the above information provided. The aforementioned taken together are felt to place the patient at high risk for further clinical deterioration. However, it is anticipated that the patient may be medically stable for discharge from the hospital within 24 to 48 hours.   Shela Leff MD [facility]  If 7PM-7AM, please contact night-coverage [email or web address]  11/01/2021, 12:02 AM

## 2021-11-01 NOTE — ED Notes (Addendum)
Called to check on purple man for report

## 2021-11-01 NOTE — H&P (Incomplete)
  History and Physical    PatientBRYLEA PITA Gentry:[id number] DOB: September 28, 1985 DOA: 10/31/2021 DOS: the patient was seen and examined on 11/01/2021 PCP: Verita Lamb, NP  Patient coming from: {Point_of_Origin:[address]}  Chief Complaint:  Chief Complaint  Patient presents with  . Post-op Problem   HPI: Cheryl Gentry is a 36 y.o. female with medical history significant of ***  Review of Systems: {ROS_Text:26778} Past Medical History:  Diagnosis Date  . Anxiety   . Depression   . GERD (gastroesophageal reflux disease)   . History of PCOS   . Hypothyroidism   . PONV (postoperative nausea and vomiting)    Past Surgical History:  Procedure Laterality Date  . ABDOMINAL HYSTERECTOMY    . CHOLECYSTECTOMY N/A 10/26/2021   Procedure: LAPAROSCOPIC CHOLECYSTECTOMY;  Surgeon: Dwan Bolt, MD;  Location: [place];  Service: General;  Laterality: N/A;  . CYSTOURETHROSCOPY    . KNEE ARTHROSCOPY Bilateral    Social History:  reports that she has never smoked. She has never used smokeless tobacco. She reports that she does not currently use alcohol. She reports that she does not use drugs.  Allergies  Allergen Reactions  . Shellfish Allergy Hives and Swelling  . Iodine Hives and Swelling    History reviewed. No pertinent family history.  Prior to Admission medications   Medication Sig Start Date End Date Taking? Authorizing Provider  ALPRAZolam Duanne Moron) 0.25 MG tablet Take 0.25 mg by mouth daily as needed for anxiety. 04/27/21  Yes [provider]  azithromycin (ZITHROMAX) 1 g powder Take 1 g by mouth daily. 5 days started 10/13 10/31/21  Yes [provider]  benzonatate (TESSALON) 100 MG capsule Take 100 mg by mouth 3 (three) times daily. 10/31/21  Yes [provider]  escitalopram (LEXAPRO) 20 MG tablet Take 20 mg by mouth daily. 05/11/21  Yes [provider]  levothyroxine (SYNTHROID) 100 MCG tablet Take 100 mcg by mouth daily. 07/08/21  Yes  [provider]  metFORMIN (GLUCOPHAGE) 500 MG tablet Take 500 mg by mouth 2 (two) times daily. 07/08/21  Yes [provider]  omeprazole (PRILOSEC OTC) 20 MG tablet Take 20 mg by mouth daily.   Yes [provider]    Physical Exam: Vitals:   10/31/21 2230 10/31/21 2245 10/31/21 2300 10/31/21 2318  BP:    116/68  Pulse: 89 95 96 97  Resp: (!) 26 (!) 25 (!) 25 (!) 21  Temp:      TempSrc:      SpO2: 94% 94% 92% 94%  Weight:      Height:       *** Data Reviewed: {Tip this will not be part of the note when signed- Document your independent interpretation of telemetry tracing, EKG, lab, Radiology test or any other diagnostic tests. Add any new diagnostic test ordered today. (Optional):26781} {Results:26384}  Assessment and Plan: No notes have been filed under this hospital service. Service: Hospitalist     Advance Care Planning:   Code Status: Not on file ***  Consults: ***  Family Communication: ***  Severity of Illness: {Observation/Inpatient:21159}  Author: Shela Leff, MD 11/01/2021 12:02 AM  For on call review [email or web address].

## 2021-11-01 NOTE — Progress Notes (Signed)
BLE venous duplex has been completed.   Results can be found under chart review under CV PROC. 11/01/2021 12:48 PM Trenee Igoe RVT, RDMS

## 2021-11-01 NOTE — ED Notes (Addendum)
Report given to [facility] ryan RN

## 2021-11-01 NOTE — Plan of Care (Signed)

## 2021-11-01 NOTE — Progress Notes (Signed)
ANTICOAGULATION CONSULT NOTE - Initial Consult  Pharmacy Consult for Eliquis Indication: pulmonary embolus  Allergies  Allergen Reactions   Shellfish Allergy Hives and Swelling   Iodine Hives and Swelling    Patient Measurements: Height: 5\' 4"  (162.6 cm) Weight: 122.5 kg (270 lb) IBW/kg (Calculated) : 54.7  Vital Signs: Temp: 98 F (36.7 C) (10/15 0000) Temp Source: Oral (10/14 2033) BP: 98/62 (10/15 0000) Pulse Rate: 100 (10/15 0000)  Labs: Recent Labs    10/31/21 1537 10/31/21 1710 10/31/21 2030  HGB 12.7  --   --   HCT 38.8  --   --   PLT 198  --   --   APTT 28  --   --   LABPROT 14.1  --   --   INR 1.1  --   --   CREATININE 0.90  --   --   TROPONINIHS  --  33* 21*    Estimated Creatinine Clearance: 110.5 mL/min (by C-G formula based on SCr of 0.9 mg/dL).   Medical History: Past Medical History:  Diagnosis Date   Anxiety    Depression    GERD (gastroesophageal reflux disease)    History of PCOS    Hypothyroidism    PONV (postoperative nausea and vomiting)     Assessment: 36 yo F with new RLL PE.  Very small clot burden per Chest CTa.  Elevated DDimer. O2 sat was 88% on admission but improved to 99% on 4L O2.  Not on anticoagulation PTA.    Pharmacy consulted to dose Eliquis.   Plan:  Eliquis 10mg  PO bid x 7 days followed by Eliquis 5mg  po bid Monitor renal fxn, s/sx of bleeding Provide patient education on new medication  Netta Cedars PharmD 11/01/2021,12:44 AM

## 2021-11-01 NOTE — Progress Notes (Signed)
PROGRESS NOTE    Cheryl Gentry  TDV:[id number] DOB: 10-Dec-1985 DOA: 10/31/2021 PCP: Verita Lamb, NP    Brief Narrative:   Cheryl Gentry is a 36 y.o. female with past medical history significant for anxiety/depression, GERD, hypothyroidism, class III obesity, PCOS, type 2 diabetes, cholelithiasis/cholecystitis status post recent laparoscopic cholecystectomy on 10/26/2021 presented to the ED with complaints of fever, cough, shortness of breath, pleuritic chest pain, and abdominal pain. Patient states she has continued to have fevers since after her surgery.  Initially it was low-grade fevers with temperature 99 F but for the past few days she has had temperature readings as high as 102 F.  Also endorsing cough, shortness of breath, and right-sided pleuritic chest pain.  She had 2 episodes of vomiting over the past few days which she thinks happened after she coughed aggressively.  She is no longer vomiting and is requesting food.  Denies abdominal pain.  Endorsing dysuria/urinary frequency and urgency.  Denies personal history of blood clots but does report family history of clotting disorder (both father and sister).  In the ED, temperature 98.9 F, HR 90, RR 22, BP 121/67, SPO2 88% on room air and placed on 2 L nasal cannula.  WBC 7.9, hemoglobin 12.7, platelet count 198.  Sodium 137, potassium 3.3, chloride 104, CO2 24, glucose 121.  Lipase 29.  AST 103, ALT 143, total bilirubin 1.5.  Lactic acid 1.0.  D-dimer elevated 5.06.  COVID-19 PCR negative.  Influenza A/B PCR negative.  Urinalysis with large leukocytes, negative nitrite, few bacteria, greater than 50 WBCs.  Chest x-ray with low lung volumes, right basilar opacity.  CT angiogram chest with right lower lobe subsegmental pulmonary embolism, left lower lobe pneumonia.  CT abdomen/pelvis s/p cholecystectomy with no drainable fluid collection/abscess, no free air.  Blood cultures x2, urine culture obtained.  Patient was given oral  potassium, started on azithromycin, ceftriaxone, metronidazole and given 2 L LR bolus.  EDP consulted [facility] for admission for further evaluation and treatment of acute hypoxic respiratory failure secondary to pulmonary embolism, commune acquired pneumonia and UTI.  Assessment & Plan:   Acute hypoxemic respiratory failure Etiology likely secondary to commune acquired pneumonia versus PE.  SPO2 84% on room air, currently satting well on 4 L O2. --Continue supplemental oxygen, wean as tolerated --Ambulatory O2 screen today  Community-acquired pneumonia CT showing evidence of left lower lobe pneumonia.  No signs of sepsis at this time.  COVID and influenza PCR negative. --Blood cultures x2: Pending --Azithromycin 500 mg IV every 24 hours, plan 5-day course --Ceftriaxone 1 g IV every 24 hours, plan 5-day course --Supportive care, antitussives   Acute right lower lobe subsegmental PE D-dimer elevated 5.06.  CTA showing possible tiny isolated subsegmental PE in the right lower lobe.  Patient is endorsing right-sided pleuritic chest pain and D-dimer is elevated.  Risk factors include family history, class III obesity, and recent surgery. --Vascular duplex ultrasound bilateral lower extremities: Pending --Antithrombin III, protein C/S, lupus anticoagulant, beta-2 glycoprotein, homocystine, factor V Leiden, prothrombin gene mutation, cardiolipin antibodies: Pending --Started on Eliquis    UTI Patient is endorsing urinary symptoms.  UA with large amount of leukocytes and microscopy showing >50 WBCs.  No signs of sepsis. --Blood culture/urine culture: Pending --Ceftriaxone 1 g IV every 24 hours   Mild hypokalemia Repleted on admission, repeat potassium this morning 3.8. -- Repeat electrolytes in a.m.  Mild transaminitis Status post recent laparoscopic cholecystectomy and CT negative for acute hepatobiliary abnormality. --CMP daily  Mild troponin elevation Likely due to demand ischemia.   Troponin mildly elevated but stable and not consistent with ACS.   Anxiety/depression --Continue Lexapro 20 mg p.o. daily, Xanax 0.25 daily prn   GERD --Continue PPI   Hypothyroidism --Continue levothyroxine 100 mcg p.o. daily   PCOS: Continue metformin  Morbid obesity Body mass index is 46.35 kg/m.  Discussed with patient needs for aggressive lifestyle changes/weight loss as this complicates all facets of care.  Outpatient follow-up with PCP.     DVT prophylaxis:  apixaban (ELIQUIS) tablet 10 mg  apixaban (ELIQUIS) tablet 5 mg    Code Status: Full Code Family Communication:   Disposition Plan:  Level of care: Telemetry Status is: Observation The patient remains OBS appropriate and will d/c before 2 midnights.    Consultants:  None  Procedures:  Vascular duplex ultrasound bilateral lower extremities: Pending  Antimicrobials:  Azithromycin 10/14>> Ceftriaxone 10/14>> Metronidazole 10/14 - 10/14   Subjective: Patient seen examined at bedside, resting calmly.  Lying in bed.  Family present.  Continues with mild shortness of breath.  Requesting restart of her metformin, it is not for diabetes but for underlying PCOS.  Also requests stopping blood sugar checks.  No other specific questions or concerns at this time.  Denies headache, no dizziness, no chest pain, no palpitations, no fever/chills/night sweats, no nausea/vomiting/diarrhea, no abdominal pain, no focal weakness, no fatigue, no cough/congestion, no paresthesias.  No acute events overnight per nursing staff.  Objective: Vitals:   11/01/21 0100 11/01/21 0145 11/01/21 0505 11/01/21 0924  BP: 118/77 120/70 110/68 122/75  Pulse: 100 (!) 103 77 84  Resp: (!) 22 18 18 16   Temp:  99 F (37.2 C) 98 F (36.7 C) 98.4 F (36.9 C)  TempSrc:  Oral Oral Oral  SpO2: 94% 93% 96% 94%  Weight:      Height:        Intake/Output Summary (Last 24 hours) at 11/01/2021 1230 Last data filed at 10/31/2021 1814 Gross per 24  hour  Intake 2200 ml  Output --  Net 2200 ml   Filed Weights   10/31/21 2033  Weight: 122.5 kg    Examination:  Physical Exam: GEN: NAD, alert and oriented x 3, obese HEENT: NCAT, PERRL, EOMI, sclera clear, MMM PULM: CTAB w/o wheezes/crackles, normal respiratory effort, on 4 L nasal cannula with SPO2 93% at rest CV: RRR w/o M/G/R GI: abd soft, NTND, NABS, no R/G/M, noted surgical incision sites that are healing well with no surrounding fluctuance/erythema MSK: no peripheral edema, muscle strength globally intact 5/5 bilateral upper/lower extremities NEURO: CN II-XII intact, no focal deficits, sensation to light touch intact PSYCH: normal mood/affect Integumentary: Abdominal surgical sites as above, otherwise no other concerning rashes/lesions/wounds noted on exposed skin surfaces \    Data Reviewed: I have personally reviewed following labs and imaging studies  CBC: Recent Labs  Lab 10/31/21 1537 11/01/21 0319  WBC 7.9 7.0  NEUTROABS 5.9  --   HGB 12.7 10.5*  HCT 38.8 32.7*  MCV 93.9 96.2  PLT 198 174   Basic Metabolic Panel: Recent Labs  Lab 10/31/21 1537 10/31/21 2030 11/01/21 0319  NA 137  --  137  K 3.3*  --  3.8  CL 104  --  107  CO2 24  --  22  GLUCOSE 121*  --  122*  BUN 10  --  11  CREATININE 0.90  --  0.71  CALCIUM 8.9  --  8.4*  MG  --  2.1  --    GFR: Estimated Creatinine Clearance: 124.3 mL/min (by C-G formula based on SCr of 0.71 mg/dL). Liver Function Tests: Recent Labs  Lab 10/31/21 1537 11/01/21 0319  AST 103* 66*  ALT 143* 110*  ALKPHOS 118 90  BILITOT 1.5* 0.9  PROT 7.5 6.3*  ALBUMIN 3.6 2.9*   Recent Labs  Lab 10/31/21 1710  LIPASE 29   No results for input(s): "AMMONIA" in the last 168 hours. Coagulation Profile: Recent Labs  Lab 10/31/21 1537  INR 1.1   Cardiac Enzymes: No results for input(s): "CKTOTAL", "CKMB", "CKMBINDEX", "TROPONINI" in the last 168 hours. BNP (last 3 results) No results for input(s):  "PROBNP" in the last 8760 hours. HbA1C: Recent Labs    11/01/21 0319  HGBA1C 5.1   CBG: Recent Labs  Lab 11/01/21 0159 11/01/21 0840 11/01/21 1112  GLUCAP 128* 101* 102*   Lipid Profile: No results for input(s): "CHOL", "HDL", "LDLCALC", "TRIG", "CHOLHDL", "LDLDIRECT" in the last 72 hours. Thyroid Function Tests: No results for input(s): "TSH", "T4TOTAL", "FREET4", "T3FREE", "THYROIDAB" in the last 72 hours. Anemia Panel: No results for input(s): "VITAMINB12", "FOLATE", "FERRITIN", "TIBC", "IRON", "RETICCTPCT" in the last 72 hours. Sepsis Labs: Recent Labs  Lab 10/31/21 1537 10/31/21 1651  LATICACIDVEN 1.0 1.0    Recent Results (from the past 240 hour(s))  Resp Panel by RT-PCR (Flu A&B, Covid) Anterior Nasal Swab     Status: None   Collection Time: 10/31/21  5:12 PM   Specimen: Anterior Nasal Swab  Result Value Ref Range Status   SARS Coronavirus 2 by RT PCR NEGATIVE NEGATIVE Final    Comment: (NOTE) SARS-CoV-2 target nucleic acids are NOT DETECTED.  The SARS-CoV-2 RNA is generally detectable in upper respiratory specimens during the acute phase of infection. The lowest concentration of SARS-CoV-2 viral copies this assay can detect is 138 copies/mL. A negative result does not preclude SARS-Cov-2 infection and should not be used as the sole basis for treatment or other patient management decisions. A negative result may occur with  improper specimen collection/handling, submission of specimen other than nasopharyngeal swab, presence of viral mutation(s) within the areas targeted by this assay, and inadequate number of viral copies(<138 copies/mL). A negative result must be combined with clinical observations, patient history, and epidemiological information. The expected result is Negative.  Fact Sheet for Patients:  BloggerCourse.com  Fact Sheet for Healthcare Providers:  SeriousBroker.it  This test is no t yet  approved or cleared by the [place] FDA and  has been authorized for detection and/or diagnosis of SARS-CoV-2 by FDA under an Emergency Use Authorization (EUA). This EUA will remain  in effect (meaning this test can be used) for the duration of the COVID-19 declaration under Section 564(b)(1) of the Act, 21 U.S.C.section 360bbb-3(b)(1), unless the authorization is terminated  or revoked sooner.       Influenza A by PCR NEGATIVE NEGATIVE Final   Influenza B by PCR NEGATIVE NEGATIVE Final    Comment: (NOTE) The Xpert Xpress SARS-CoV-2/FLU/RSV plus assay is intended as an aid in the diagnosis of influenza from Nasopharyngeal swab specimens and should not be used as a sole basis for treatment. Nasal washings and aspirates are unacceptable for Xpert Xpress SARS-CoV-2/FLU/RSV testing.  Fact Sheet for Patients: BloggerCourse.com  Fact Sheet for Healthcare Providers: SeriousBroker.it  This test is not yet approved or cleared by the [place] FDA and has been authorized for detection and/or diagnosis of SARS-CoV-2 by FDA under an Emergency Use Authorization (EUA). This  EUA will remain in effect (meaning this test can be used) for the duration of the COVID-19 declaration under Section 564(b)(1) of the Act, 21 U.S.C. section 360bbb-3(b)(1), unless the authorization is terminated or revoked.  Performed at [facility], [address] [place], [place] [address]          Radiology Studies: CT Angio Chest PE W and/or Wo Contrast  Result Date: 10/31/2021 CLINICAL DATA:  Five days status post cholecystectomy, fevers, abdominal pain. Evaluate for PE. EXAM: CT ANGIOGRAPHY CHEST CT ABDOMEN AND PELVIS WITH CONTRAST TECHNIQUE: Multidetector CT imaging of the chest was performed using the standard protocol during bolus administration of intravenous contrast. Multiplanar CT image reconstructions and MIPs were obtained  to evaluate the vascular anatomy. Multidetector CT imaging of the abdomen and pelvis was performed using the standard protocol during bolus administration of intravenous contrast. RADIATION DOSE REDUCTION: This exam was performed according to the departmental dose-optimization program which includes automated exposure control, adjustment of the mA and/or kV according to patient size and/or use of iterative reconstruction technique. CONTRAST:  OMNIPAQUE IOHEXOL 350 MG/ML SOLN COMPARISON:  Chest radiograph dated 10/31/2021 FINDINGS: CTA CHEST FINDINGS Cardiovascular: Limited evaluation of the bilateral pulmonary arteries due to bolus timing and motion degradation. Satisfactory opacification to the lobar level. Possible tiny subsegmental pulmonary embolism in the medial right lower lobe (series 5/image 126 and 132), although equivocal due to motion degradation. If this reflects a true finding, the clot burden is very small. Although not tailored for evaluation of the thoracic aorta, there is no evidence of thoracic aortic aneurysm or dissection. The heart is normal in size.  No pericardial effusion. Mediastinum/Nodes: No suspicious mediastinal lymphadenopathy. Visualized thyroid is unremarkable. Lungs/Pleura: Evaluation lung parenchyma is constrained by respiratory motion. Within that constraint, there are no suspicious pulmonary nodules. Patchy medial left lower lobe opacity (series 10/image 87), suspicious for pneumonia. No pleural effusion or pneumothorax. Musculoskeletal: Mild degenerative changes of the lower thoracic spine. Review of the MIP images confirms the above findings. CT ABDOMEN and PELVIS FINDINGS Hepatobiliary: Liver is within normal limits. Status post cholecystectomy. No intrahepatic or extrahepatic ductal dilatation. Pancreas: Within normal limits. Spleen: Within normal limits. Adrenals/Urinary Tract: Adrenal glands are within normal limits. Kidneys are within normal limits.  No  hydronephrosis. Bladder is within normal limits. Stomach/Bowel: Stomach is within normal limits. No evidence of bowel obstruction. Normal appendix (series 2/image 85). No colonic wall thickening or inflammatory changes. Vascular/Lymphatic: No evidence of abdominal aortic aneurysm. No suspicious abdominopelvic lymphadenopathy. Reproductive: Status post hysterectomy. Bilateral ovaries are within normal limits. Other: No abdominopelvic ascites. No drainable fluid collection/abscess.  No free air. Musculoskeletal: Visualized osseous structures are within normal limits. Review of the MIP images confirms the above findings. IMPRESSION: Limited evaluation. Possible tiny isolated subsegmental pulmonary embolism in the right lower lobe, equivocal due to motion degradation. If this reflects a true finding, the clot burden is very small. Left lower lobe pneumonia. Status post cholecystectomy. No drainable fluid collection/abscess. No free air. Electronically Signed   By: Charline Bills M.D.   On: 10/31/2021 21:34   CT ABDOMEN PELVIS W CONTRAST  Result Date: 10/31/2021 CLINICAL DATA:  Five days status post cholecystectomy, fevers, abdominal pain. Evaluate for PE. EXAM: CT ANGIOGRAPHY CHEST CT ABDOMEN AND PELVIS WITH CONTRAST TECHNIQUE: Multidetector CT imaging of the chest was performed using the standard protocol during bolus administration of intravenous contrast. Multiplanar CT image reconstructions and MIPs were obtained to evaluate the vascular anatomy. Multidetector CT imaging of  the abdomen and pelvis was performed using the standard protocol during bolus administration of intravenous contrast. RADIATION DOSE REDUCTION: This exam was performed according to the departmental dose-optimization program which includes automated exposure control, adjustment of the mA and/or kV according to patient size and/or use of iterative reconstruction technique. CONTRAST:  OMNIPAQUE IOHEXOL 350 MG/ML SOLN COMPARISON:  Chest  radiograph dated 10/31/2021 FINDINGS: CTA CHEST FINDINGS Cardiovascular: Limited evaluation of the bilateral pulmonary arteries due to bolus timing and motion degradation. Satisfactory opacification to the lobar level. Possible tiny subsegmental pulmonary embolism in the medial right lower lobe (series 5/image 126 and 132), although equivocal due to motion degradation. If this reflects a true finding, the clot burden is very small. Although not tailored for evaluation of the thoracic aorta, there is no evidence of thoracic aortic aneurysm or dissection. The heart is normal in size.  No pericardial effusion. Mediastinum/Nodes: No suspicious mediastinal lymphadenopathy. Visualized thyroid is unremarkable. Lungs/Pleura: Evaluation lung parenchyma is constrained by respiratory motion. Within that constraint, there are no suspicious pulmonary nodules. Patchy medial left lower lobe opacity (series 10/image 87), suspicious for pneumonia. No pleural effusion or pneumothorax. Musculoskeletal: Mild degenerative changes of the lower thoracic spine. Review of the MIP images confirms the above findings. CT ABDOMEN and PELVIS FINDINGS Hepatobiliary: Liver is within normal limits. Status post cholecystectomy. No intrahepatic or extrahepatic ductal dilatation. Pancreas: Within normal limits. Spleen: Within normal limits. Adrenals/Urinary Tract: Adrenal glands are within normal limits. Kidneys are within normal limits.  No hydronephrosis. Bladder is within normal limits. Stomach/Bowel: Stomach is within normal limits. No evidence of bowel obstruction. Normal appendix (series 2/image 85). No colonic wall thickening or inflammatory changes. Vascular/Lymphatic: No evidence of abdominal aortic aneurysm. No suspicious abdominopelvic lymphadenopathy. Reproductive: Status post hysterectomy. Bilateral ovaries are within normal limits. Other: No abdominopelvic ascites. No drainable fluid collection/abscess.  No free air. Musculoskeletal:  Visualized osseous structures are within normal limits. Review of the MIP images confirms the above findings. IMPRESSION: Limited evaluation. Possible tiny isolated subsegmental pulmonary embolism in the right lower lobe, equivocal due to motion degradation. If this reflects a true finding, the clot burden is very small. Left lower lobe pneumonia. Status post cholecystectomy. No drainable fluid collection/abscess. No free air. Electronically Signed   By: Charline Bills M.D.   On: 10/31/2021 21:34   DG Chest Port 1 View  Result Date: 10/31/2021 CLINICAL DATA:  Questionable sepsis - evaluate for abnormality EXAM: PORTABLE CHEST 1 VIEW COMPARISON:  None Available. FINDINGS: Low lung volumes mild/subtle right basilar opacity. No visible pleural effusion or pneumothorax. Cardiomediastinal silhouette is within normal limits. No acute osseous abnormality. IMPRESSION: Low lung volumes mild/subtle right basilar opacity, which could represent atelectasis or early pneumonia. Dedicated PA and lateral radiographs could better assess if clinically warranted. Electronically Signed   By: Feliberto Harts M.D.   On: 10/31/2021 15:30        Scheduled Meds:  apixaban  10 mg Oral BID   Followed by   Melene Muller ON 11/07/2021] apixaban  5 mg Oral BID   escitalopram  20 mg Oral Daily   insulin aspart  0-5 Units Subcutaneous QHS   insulin aspart  0-9 Units Subcutaneous TID WC   levothyroxine  100 mcg Oral Q0600   [START ON 11/03/2021] metFORMIN  500 mg Oral BID WC   pantoprazole  40 mg Oral Daily   Continuous Infusions:  azithromycin 500 mg (10/31/21 2320)   cefTRIAXone (ROCEPHIN)  IV       LOS: 0  days    Time spent: 51 minutes spent on chart review, discussion with nursing staff, consultants, updating family and interview/physical exam; more than 50% of that time was spent in counseling and/or coordination of care.    Alvira Philips [place], DO [facility] Available via Epic secure chat 7am-7pm After  these hours, please refer to coverage provider listed on [email or web address] 11/01/2021, 12:30 PM

## 2021-11-02 DIAGNOSIS — J9621 Acute and chronic respiratory failure with hypoxia: Secondary | ICD-10-CM

## 2021-11-02 LAB — COMPREHENSIVE METABOLIC PANEL
ALT: 83 U/L — ABNORMAL HIGH (ref 0–44)
AST: 41 U/L (ref 15–41)
Albumin: 2.9 g/dL — ABNORMAL LOW (ref 3.5–5.0)
Alkaline Phosphatase: 74 U/L (ref 38–126)
Anion gap: 6 (ref 5–15)
BUN: 14 mg/dL (ref 6–20)
CO2: 25 mmol/L (ref 22–32)
Calcium: 8.1 mg/dL — ABNORMAL LOW (ref 8.9–10.3)
Chloride: 109 mmol/L (ref 98–111)
Creatinine, Ser: 0.79 mg/dL (ref 0.44–1.00)
GFR, Estimated: >60 mL/min (ref >60)
Glucose, Bld: 100 mg/dL — ABNORMAL HIGH (ref 70–99)
Potassium: 3.8 mmol/L (ref 3.5–5.1)
Sodium: 140 mmol/L (ref 135–145)
Total Bilirubin: 0.6 mg/dL (ref 0.3–1.2)
Total Protein: 6.1 g/dL — ABNORMAL LOW (ref 6.5–8.1)

## 2021-11-02 LAB — CBC
HCT: 32.9 % — ABNORMAL LOW (ref 36.0–46.0)
Hemoglobin: 10.5 g/dL — ABNORMAL LOW (ref 12.0–15.0)
MCH: 31.3 pg (ref 26.0–34.0)
MCHC: 31.9 g/dL (ref 30.0–36.0)
MCV: 98.2 fL (ref 80.0–100.0)
Platelets: 184 10*3/uL (ref 150–400)
RBC: 3.35 MIL/uL — ABNORMAL LOW (ref 3.87–5.11)
RDW: 14 % (ref 11.5–15.5)
WBC: 5.2 10*3/uL (ref 4.0–10.5)
nRBC: 0 % (ref 0.0–0.2)

## 2021-11-02 LAB — PROTEIN C ACTIVITY: Protein C Activity: 107 % (ref 73–180)

## 2021-11-02 LAB — DRVVT CONFIRM: dRVVT Confirm: 1 ratio (ref 0.8–1.2)

## 2021-11-02 LAB — URINE CULTURE: Culture: NO GROWTH

## 2021-11-02 LAB — DRVVT MIX: dRVVT Mix: 42.9 s — ABNORMAL HIGH (ref 0.0–40.4)

## 2021-11-02 LAB — PROTEIN S, TOTAL: Protein S Ag, Total: 84 % (ref 60–150)

## 2021-11-02 LAB — LUPUS ANTICOAGULANT PANEL
DRVVT: 50.9 s — ABNORMAL HIGH (ref 0.0–47.0)
PTT Lupus Anticoagulant: 21 s (ref 0.0–43.5)

## 2021-11-02 LAB — PROTEIN S ACTIVITY: Protein S Activity: 40 % — ABNORMAL LOW (ref 63–140)

## 2021-11-02 LAB — MAGNESIUM: Magnesium: 2.3 mg/dL (ref 1.7–2.4)

## 2021-11-02 MED ORDER — ALBUTEROL SULFATE (2.5 MG/3ML) 0.083% IN NEBU: 2.5000 mg | INHALATION_SOLUTION | Freq: Four times a day (QID) | RESPIRATORY_TRACT | Status: DC | PRN

## 2021-11-02 MED ORDER — NAPHAZOLINE-GLYCERIN 0.012-0.25 % OP SOLN: 1.0000 [drp] | Freq: Four times a day (QID) | OPHTHALMIC | Status: DC | PRN

## 2021-11-02 MED ORDER — GUAIFENESIN ER 600 MG PO TB12
600.0000 mg | ORAL_TABLET | Freq: Two times a day (BID) | ORAL | Status: DC
  Administered 2021-11-02 – 2021-11-04 (×5): 600 mg via ORAL
  Filled 2021-11-02 (×5): qty 1

## 2021-11-02 NOTE — TOC Initial Note (Signed)
Transition of Care [facility]) - Initial/Assessment Note    Patient Details  Name: Cheryl Gentry MRN: [id number] Date of Birth: 1985-02-28  Transition of Care [facility]) CM/SW Contact:    Vassie Moselle, LCSW Phone Number: 11/02/2021, 12:28 PM  Clinical Narrative:                 TOC following for discharge planning. Pt currently requiring 2L O2 and is to have home O2 sats completed.   Expected Discharge Plan: Home/Self Care Barriers to Discharge: Continued Medical Work up   Patient Goals and CMS Choice Patient states their goals for this hospitalization and ongoing recovery are:: Unable to obtain CMS Medicare.gov Compare Post Acute Care list provided to:: Patient Choice offered to / list presented to : Patient  Expected Discharge Plan and Services Expected Discharge Plan: Home/Self Care In-house Referral: NA Discharge Planning Services: NA   Living arrangements for the past 2 months: Single Family Home                                      Prior Living Arrangements/Services Living arrangements for the past 2 months: Single Family Home Lives with:: Self Patient language and need for interpreter reviewed:: Yes Do you feel safe going back to the place where you live?: Yes      Need for Family Participation in Patient Care: No (Comment) Care giver support system in place?: No (comment)   Criminal Activity/Legal Involvement Pertinent to Current Situation/Hospitalization: No - Comment as needed  Activities of Daily Living Home Assistive Devices/Equipment: None ADL Screening (condition at time of admission) Patient's cognitive ability adequate to safely complete daily activities?: Yes Is the patient deaf or have difficulty hearing?: No Does the patient have difficulty seeing, even when wearing glasses/contacts?: No Does the patient have difficulty concentrating, remembering, or making decisions?: No Patient able to express need for assistance with ADLs?: Yes Does the  patient have difficulty dressing or bathing?: No Independently performs ADLs?: Yes (appropriate for developmental age) Does the patient have difficulty walking or climbing stairs?: No Weakness of Legs: None Weakness of Arms/Hands: None  Permission Sought/Granted   Permission granted to share information with : No              Emotional Assessment Appearance:: Other (Comment Required (Did not meet pt face to face) Attitude/Demeanor/Rapport: Unable to Assess Affect (typically observed): Unable to Assess Orientation: : Oriented to Self, Oriented to Place, Oriented to  Time, Oriented to Situation Alcohol / Substance Use: Not Applicable Psych Involvement: No (comment)  Admission diagnosis:  CAP (community acquired pneumonia) [J18.9] Acute cystitis without hematuria [N30.00] Acute respiratory failure with hypoxia ([place]) [J96.01] Recent major surgery [Z98.890] Right-sided chest pain [R07.9] Pneumonia of left lower lobe due to infectious organism [J18.9] Acute on chronic respiratory failure with hypoxia ([facility]) [J96.21] Patient Active Problem List   Diagnosis Date Noted   CAP (community acquired pneumonia) 11/01/2021   Acute pulmonary embolism ([place]) 11/01/2021   Acute hypoxemic respiratory failure ([place]) 11/01/2021   UTI (urinary tract infection) 11/01/2021   Hypokalemia 11/01/2021   Transaminitis 11/01/2021   Type 2 diabetes mellitus ([place]) 11/01/2021   Hypothyroidism 11/01/2021   Acute on chronic respiratory failure with hypoxia ([place]) 11/01/2021   PCP:  Verita Lamb, NP Pharmacy:   [id number] - [place], [place]. [address] [place] [address] Phone: [phone] Fax: [phone]  Social Determinants of Health (SDOH) Interventions Food Insecurity Interventions: Intervention Not Indicated Housing Interventions: Intervention Not Indicated Transportation Interventions: Intervention Not Indicated Utilities Interventions: Intervention Not  Indicated  Readmission Risk Interventions    11/02/2021   12:27 PM  Readmission Risk Prevention Plan  Post Dischage Appt Complete  Medication Screening Complete  Transportation Screening Complete

## 2021-11-02 NOTE — Progress Notes (Addendum)
PROGRESS NOTE    Cheryl Gentry  OJJ:[id number] DOB: 01-03-1986 DOA: 10/31/2021 PCP: Etheleen Nicks, NP    Brief Narrative:   Cheryl Gentry is a 36 y.o. female with past medical history significant for anxiety/depression, GERD, hypothyroidism, class III obesity, PCOS, type 2 diabetes, cholelithiasis/cholecystitis status post recent laparoscopic cholecystectomy on 10/26/2021 presented to the ED with complaints of fever, cough, shortness of breath, pleuritic chest pain, and abdominal pain. Patient states she has continued to have fevers since after her surgery.  Initially it was low-grade fevers with temperature 99 F but for the past few days she has had temperature readings as high as 102 F.  Also endorsing cough, shortness of breath, and right-sided pleuritic chest pain.  She had 2 episodes of vomiting over the past few days which she thinks happened after she coughed aggressively.  She is no longer vomiting and is requesting food.  Denies abdominal pain.  Endorsing dysuria/urinary frequency and urgency.  Denies personal history of blood clots but does report family history of clotting disorder (both father and sister).  In the ED, temperature 98.9 F, HR 90, RR 22, BP 121/67, SPO2 88% on room air and placed on 2 L nasal cannula.  WBC 7.9, hemoglobin 12.7, platelet count 198.  Sodium 137, potassium 3.3, chloride 104, CO2 24, glucose 121.  Lipase 29.  AST 103, ALT 143, total bilirubin 1.5.  Lactic acid 1.0.  D-dimer elevated 5.06.  COVID-19 PCR negative.  Influenza A/B PCR negative.  Urinalysis with large leukocytes, negative nitrite, few bacteria, greater than 50 WBCs.  Chest x-ray with low lung volumes, right basilar opacity.  CT angiogram chest with right lower lobe subsegmental pulmonary embolism, left lower lobe pneumonia.  CT abdomen/pelvis s/p cholecystectomy with no drainable fluid collection/abscess, no free air.  Blood cultures x2, urine culture obtained.  Patient was given oral  potassium, started on azithromycin, ceftriaxone, metronidazole and given 2 L LR bolus.  EDP consulted [facility] for admission for further evaluation and treatment of acute hypoxic respiratory failure secondary to pulmonary embolism, commune acquired pneumonia and UTI.  Assessment & Plan:   Acute hypoxemic respiratory failure Etiology likely secondary to commune acquired pneumonia versus PE.  SPO2 84% on room air, currently requiring 4 L nasal cannula --Continue supplemental oxygen, wean as tolerated --Ambulatory O2 screen today  Community-acquired pneumonia CT showing evidence of left lower lobe pneumonia.  No signs of sepsis at this time.  COVID and influenza PCR negative. --Blood cultures x2: Pending --Azithromycin 500 mg IV every 24 hours, plan 5-day course --Ceftriaxone 1 g IV every 24 hours, plan 5-day course --Mucinex 600 mg p.o. every 12 hours --Continue supplemental oxygen, maintain SPO2 greater than 92% --Incentive spirometry, flutter valve --Supportive care, antitussives   Acute right lower lobe subsegmental PE D-dimer elevated 5.06.  CTA showing possible tiny isolated subsegmental PE in the right lower lobe.  Patient is endorsing right-sided pleuritic chest pain and D-dimer is elevated.  Risk factors include family history, class III obesity, and recent surgery. Vascular duplex ultrasound bilateral lower extremities negative for DVT. --Antithrombin III, protein C/S, lupus anticoagulant, beta-2 glycoprotein, homocystine, factor V Leiden, prothrombin gene mutation, cardiolipin antibodies: Pending --Started on Eliquis    UTI Patient is endorsing urinary symptoms.  UA with large amount of leukocytes and microscopy showing >50 WBCs.  No signs of sepsis.  Urine culture with no growth.  On antibiotics as above.   Mild hypokalemia: Resolved Repleted on admission, repeat potassium this morning 3.8.  Mild transaminitis:  Improving Status post recent laparoscopic cholecystectomy and CT  negative for acute hepatobiliary abnormality. --CMP daily  Subconjunctival hemorrhage, right eye --Saline eyedrops as needed   Mild troponin elevation Likely due to demand ischemia.  Troponin mildly elevated but stable and not consistent with ACS.   Anxiety/depression --Continue Lexapro 20 mg p.o. daily, Xanax 0.25 daily prn   GERD --Continue PPI   Hypothyroidism --Continue levothyroxine 100 mcg p.o. daily   PCOS: Continue metformin  Morbid obesity Body mass index is 46.35 kg/m.  Discussed with patient needs for aggressive lifestyle changes/weight loss as this complicates all facets of care.  Outpatient follow-up with PCP.     DVT prophylaxis:  apixaban (ELIQUIS) tablet 10 mg  apixaban (ELIQUIS) tablet 5 mg    Code Status: Full Code Family Communication: No family present at bedside this morning, updated family present at bedside yesterday afternoon  Disposition Plan:  Level of care: Med-Surg Status is: Inpatient Remains inpatient appropriate because: Continues require supplemental oxygen with desaturation with minimal exertion, IV antibiotics     Consultants:  None  Procedures:  Vascular duplex ultrasound bilateral lower extremities  Antimicrobials:  Azithromycin 10/14>> Ceftriaxone 10/14>> Metronidazole 10/14 - 10/14   Subjective: Patient seen examined at bedside, resting calmly.  Lying in bed.  No family present.  RN at bedside.  Desaturated to 84% on 2 L nasal cannula now back up to 4 L while resting in bed.  Continues to endorse shortness of breath.  Also with mild congestion.  No other specific questions or concerns at this time.  Denies headache, no dizziness, no chest pain, no palpitations, no fever/chills/night sweats, no nausea/vomiting/diarrhea, no abdominal pain, no focal weakness, no fatigue, no cough/congestion, no paresthesias.  No acute events overnight per nursing staff.  Objective: Vitals:   11/01/21 1423 11/01/21 2156 11/02/21 0539 11/02/21  0542  BP: 134/86 112/77  119/76  Pulse: 85 79  80  Resp: 18 20  18   Temp: 98 F (36.7 C) 97.7 F (36.5 C)  98.1 F (36.7 C)  TempSrc: Oral Oral  Oral  SpO2: 98% 96% 95% 97%  Weight:      Height:        Intake/Output Summary (Last 24 hours) at 11/02/2021 1112 Last data filed at 11/01/2021 2114 Gross per 24 hour  Intake 120 ml  Output --  Net 120 ml   Filed Weights   10/31/21 2033  Weight: 122.5 kg    Examination:  Physical Exam: GEN: NAD, alert and oriented x 3, obese HEENT: NCAT, PERRL, EOMI, right eye with notable subconjunctival hemorrhage, otherwise clear, MMM PULM: CTAB w/o wheezes/crackles, normal respiratory effort, on 4 L nasal cannula with SPO2 93% at rest CV: RRR w/o M/G/R GI: abd soft, NTND, NABS, no R/G/M, noted surgical incision Gentry that are healing well with no surrounding fluctuance/erythema MSK: no peripheral edema, muscle strength globally intact 5/5 bilateral upper/lower extremities NEURO: CN II-XII intact, no focal deficits, sensation to light touch intact PSYCH: normal mood/affect Integumentary: Abdominal surgical Gentry as above, otherwise no other concerning rashes/lesions/wounds noted on exposed skin surfaces     Data Reviewed: I have personally reviewed following labs and imaging studies  CBC: Recent Labs  Lab 10/31/21 1537 11/01/21 0319 11/02/21 0426  WBC 7.9 7.0 5.2  NEUTROABS 5.9  --   --   HGB 12.7 10.5* 10.5*  HCT 38.8 32.7* 32.9*  MCV 93.9 96.2 98.2  PLT 198 174 295   Basic Metabolic Panel: Recent Labs  Lab 10/31/21 1537 10/31/21  2030 11/01/21 0319 11/02/21 0426  NA 137  --  137 140  K 3.3*  --  3.8 3.8  CL 104  --  107 109  CO2 24  --  22 25  GLUCOSE 121*  --  122* 100*  BUN 10  --  11 14  CREATININE 0.90  --  0.71 0.79  CALCIUM 8.9  --  8.4* 8.1*  MG  --  2.1  --  2.3   GFR: Estimated Creatinine Clearance: 124.3 mL/min (by C-G formula based on SCr of 0.79 mg/dL). Liver Function Tests: Recent Labs  Lab  10/31/21 1537 11/01/21 0319 11/02/21 0426  AST 103* 66* 41  ALT 143* 110* 83*  ALKPHOS 118 90 74  BILITOT 1.5* 0.9 0.6  PROT 7.5 6.3* 6.1*  ALBUMIN 3.6 2.9* 2.9*   Recent Labs  Lab 10/31/21 1710  LIPASE 29   No results for input(s): "AMMONIA" in the last 168 hours. Coagulation Profile: Recent Labs  Lab 10/31/21 1537  INR 1.1   Cardiac Enzymes: No results for input(s): "CKTOTAL", "CKMB", "CKMBINDEX", "TROPONINI" in the last 168 hours. BNP (last 3 results) No results for input(s): "PROBNP" in the last 8760 hours. HbA1C: Recent Labs    11/01/21 0319  HGBA1C 5.1   CBG: Recent Labs  Lab 11/01/21 0159 11/01/21 0840 11/01/21 1112  GLUCAP 128* 101* 102*   Lipid Profile: No results for input(s): "CHOL", "HDL", "LDLCALC", "TRIG", "CHOLHDL", "LDLDIRECT" in the last 72 hours. Thyroid Function Tests: No results for input(s): "TSH", "T4TOTAL", "FREET4", "T3FREE", "THYROIDAB" in the last 72 hours. Anemia Panel: No results for input(s): "VITAMINB12", "FOLATE", "FERRITIN", "TIBC", "IRON", "RETICCTPCT" in the last 72 hours. Sepsis Labs: Recent Labs  Lab 10/31/21 1537 10/31/21 1651  LATICACIDVEN 1.0 1.0    Recent Results (from the past 240 hour(s))  Resp Panel by RT-PCR (Flu A&B, Covid) Anterior Nasal Swab     Status: None   Collection Time: 10/31/21  5:12 PM   Specimen: Anterior Nasal Swab  Result Value Ref Range Status   SARS Coronavirus 2 by RT PCR NEGATIVE NEGATIVE Final    Comment: (NOTE) SARS-CoV-2 target nucleic acids are NOT DETECTED.  The SARS-CoV-2 RNA is generally detectable in upper respiratory specimens during the acute phase of infection. The lowest concentration of SARS-CoV-2 viral copies this assay can detect is 138 copies/mL. A negative result does not preclude SARS-Cov-2 infection and should not be used as the sole basis for treatment or other patient management decisions. A negative result may occur with  improper specimen collection/handling,  submission of specimen other than nasopharyngeal swab, presence of viral mutation(s) within the areas targeted by this assay, and inadequate number of viral copies(<138 copies/mL). A negative result must be combined with clinical observations, patient history, and epidemiological information. The expected result is Negative.  Fact Sheet for Patients:  BloggerCourse.com  Fact Sheet for Healthcare Providers:  SeriousBroker.it  This test is no t yet approved or cleared by the [place] FDA and  has been authorized for detection and/or diagnosis of SARS-CoV-2 by FDA under an Emergency Use Authorization (EUA). This EUA will remain  in effect (meaning this test can be used) for the duration of the COVID-19 declaration under Section 564(b)(1) of the Act, 21 U.S.C.section 360bbb-3(b)(1), unless the authorization is terminated  or revoked sooner.       Influenza A by PCR NEGATIVE NEGATIVE Final   Influenza B by PCR NEGATIVE NEGATIVE Final    Comment: (NOTE) The Xpert Xpress SARS-CoV-2/FLU/RSV plus  assay is intended as an aid in the diagnosis of influenza from Nasopharyngeal swab specimens and should not be used as a sole basis for treatment. Nasal washings and aspirates are unacceptable for Xpert Xpress SARS-CoV-2/FLU/RSV testing.  Fact Sheet for Patients: BloggerCourse.comhttps://www.fda.gov/media/152166/download  Fact Sheet for Healthcare Providers: SeriousBroker.ithttps://www.fda.gov/media/152162/download  This test is not yet approved or cleared by the [place] FDA and has been authorized for detection and/or diagnosis of SARS-CoV-2 by FDA under an Emergency Use Authorization (EUA). This EUA will remain in effect (meaning this test can be used) for the duration of the COVID-19 declaration under Section 564(b)(1) of the Act, 21 U.S.C. section 360bbb-3(b)(1), unless the authorization is terminated or revoked.  Performed at [facility], [address] [place], [place] [address]   Urine Culture     Status: None   Collection Time: 10/31/21  8:38 PM   Specimen: Urine, Clean Catch  Result Value Ref Range Status   Specimen Description   Final    URINE, CLEAN CATCH Performed at [facility], [address] [place], [place] [address]    Special Requests   Final    NONE Performed at [facility], [address] [place], [place] [address]    Culture   Final    NO GROWTH Performed at [facility], [address] [place], [place] [address]    Report Status 11/02/2021 FINAL  Final         Radiology Studies: VAS US LOWER EXTREMITY VENOUS (DVT)  Result Date: 11/01/2021  Lower Venous DVT Study Patient Name:  Nakiesha A Faron  Date of Exam:   11/01/2021 Medical Rec #: [id number]         Accession #:    [id number] Date of Birth: 09/23/1985         Patient Gender: F Patient Age:   [age] years Exam [facility]  [facility] Procedure:      VAS US LOWER EXTREMITY VENOUS (DVT) Referring Phys: Ulyess BlossomVASUNDHRA RATHORE --------------------------------------------------------------------------------  Indications: Pulmonary embolism.  Risk Factors: Surgery Cholecystectomy 10/26/21. Comparison Study: No previous exams Performing Technologist: Jody Hill RVT, RDMS  Examination Guidelines: A complete evaluation includes B-mode imaging, spectral Doppler, color Doppler, and power Doppler as needed of all accessible portions of each vessel. Bilateral testing is considered an integral part of a complete examination. Limited examinations for reoccurring indications may be performed as noted. The reflux portion of the exam is performed with the patient in reverse Trendelenburg.  +---------+---------------+---------+-----------+----------+--------------+ RIGHT    CompressibilityPhasicitySpontaneityPropertiesThrombus Aging +---------+---------------+---------+-----------+----------+--------------+  CFV      Full           Yes      Yes                                 +---------+---------------+---------+-----------+----------+--------------+ SFJ      Full                                                        +---------+---------------+---------+-----------+----------+--------------+ FV Prox  Full           Yes      Yes                                 +---------+---------------+---------+-----------+----------+--------------+  FV Mid   Full           Yes      Yes                                 +---------+---------------+---------+-----------+----------+--------------+ FV DistalFull           Yes      Yes                                 +---------+---------------+---------+-----------+----------+--------------+ PFV      Full                                                        +---------+---------------+---------+-----------+----------+--------------+ POP      Full           Yes      Yes                                 +---------+---------------+---------+-----------+----------+--------------+ PTV      Full                                                        +---------+---------------+---------+-----------+----------+--------------+ PERO     Full                                                        +---------+---------------+---------+-----------+----------+--------------+   +---------+---------------+---------+-----------+----------+--------------+ LEFT     CompressibilityPhasicitySpontaneityPropertiesThrombus Aging +---------+---------------+---------+-----------+----------+--------------+ CFV      Full           Yes      Yes                                 +---------+---------------+---------+-----------+----------+--------------+ SFJ      Full                                                        +---------+---------------+---------+-----------+----------+--------------+ FV Prox  Full           Yes      Yes                                  +---------+---------------+---------+-----------+----------+--------------+ FV Mid   Full           Yes      Yes                                 +---------+---------------+---------+-----------+----------+--------------+ FV DistalFull  Yes      Yes                                 +---------+---------------+---------+-----------+----------+--------------+ PFV      Full                                                        +---------+---------------+---------+-----------+----------+--------------+ POP      Full           Yes      Yes                                 +---------+---------------+---------+-----------+----------+--------------+ PTV      Full                                                        +---------+---------------+---------+-----------+----------+--------------+ PERO     Full                                                        +---------+---------------+---------+-----------+----------+--------------+     Summary: BILATERAL: - No evidence of deep vein thrombosis seen in the lower extremities, bilaterally. -No evidence of popliteal cyst, bilaterally.   *See table(s) above for measurements and observations. Electronically signed by Sherald Hess MD on 11/01/2021 at 2:04:14 PM.    Final    CT Angio Chest PE W and/or Wo Contrast  Result Date: 10/31/2021 CLINICAL DATA:  Five days status post cholecystectomy, fevers, abdominal pain. Evaluate for PE. EXAM: CT ANGIOGRAPHY CHEST CT ABDOMEN AND PELVIS WITH CONTRAST TECHNIQUE: Multidetector CT imaging of the chest was performed using the standard protocol during bolus administration of intravenous contrast. Multiplanar CT image reconstructions and MIPs were obtained to evaluate the vascular anatomy. Multidetector CT imaging of the abdomen and pelvis was performed using the standard protocol during bolus administration of intravenous contrast. RADIATION DOSE REDUCTION:  This exam was performed according to the departmental dose-optimization program which includes automated exposure control, adjustment of the mA and/or kV according to patient size and/or use of iterative reconstruction technique. CONTRAST:  OMNIPAQUE IOHEXOL 350 MG/ML SOLN COMPARISON:  Chest radiograph dated 10/31/2021 FINDINGS: CTA CHEST FINDINGS Cardiovascular: Limited evaluation of the bilateral pulmonary arteries due to bolus timing and motion degradation. Satisfactory opacification to the lobar level. Possible tiny subsegmental pulmonary embolism in the medial right lower lobe (series 5/image 126 and 132), although equivocal due to motion degradation. If this reflects a true finding, the clot burden is very small. Although not tailored for evaluation of the thoracic aorta, there is no evidence of thoracic aortic aneurysm or dissection. The heart is normal in size.  No pericardial effusion. Mediastinum/Nodes: No suspicious mediastinal lymphadenopathy. Visualized thyroid is unremarkable. Lungs/Pleura: Evaluation lung parenchyma is constrained by respiratory motion. Within that constraint, there are no suspicious pulmonary nodules. Patchy medial left lower lobe opacity (series 10/image 87), suspicious for  pneumonia. No pleural effusion or pneumothorax. Musculoskeletal: Mild degenerative changes of the lower thoracic spine. Review of the MIP images confirms the above findings. CT ABDOMEN and PELVIS FINDINGS Hepatobiliary: Liver is within normal limits. Status post cholecystectomy. No intrahepatic or extrahepatic ductal dilatation. Pancreas: Within normal limits. Spleen: Within normal limits. Adrenals/Urinary Tract: Adrenal glands are within normal limits. Kidneys are within normal limits.  No hydronephrosis. Bladder is within normal limits. Stomach/Bowel: Stomach is within normal limits. No evidence of bowel obstruction. Normal appendix (series 2/image 85). No colonic wall thickening or inflammatory changes.  Vascular/Lymphatic: No evidence of abdominal aortic aneurysm. No suspicious abdominopelvic lymphadenopathy. Reproductive: Status post hysterectomy. Bilateral ovaries are within normal limits. Other: No abdominopelvic ascites. No drainable fluid collection/abscess.  No free air. Musculoskeletal: Visualized osseous structures are within normal limits. Review of the MIP images confirms the above findings. IMPRESSION: Limited evaluation. Possible tiny isolated subsegmental pulmonary embolism in the right lower lobe, equivocal due to motion degradation. If this reflects a true finding, the clot burden is very small. Left lower lobe pneumonia. Status post cholecystectomy. No drainable fluid collection/abscess. No free air. Electronically Signed   By: Charline Bills M.D.   On: 10/31/2021 21:34   CT ABDOMEN PELVIS W CONTRAST  Result Date: 10/31/2021 CLINICAL DATA:  Five days status post cholecystectomy, fevers, abdominal pain. Evaluate for PE. EXAM: CT ANGIOGRAPHY CHEST CT ABDOMEN AND PELVIS WITH CONTRAST TECHNIQUE: Multidetector CT imaging of the chest was performed using the standard protocol during bolus administration of intravenous contrast. Multiplanar CT image reconstructions and MIPs were obtained to evaluate the vascular anatomy. Multidetector CT imaging of the abdomen and pelvis was performed using the standard protocol during bolus administration of intravenous contrast. RADIATION DOSE REDUCTION: This exam was performed according to the departmental dose-optimization program which includes automated exposure control, adjustment of the mA and/or kV according to patient size and/or use of iterative reconstruction technique. CONTRAST:  OMNIPAQUE IOHEXOL 350 MG/ML SOLN COMPARISON:  Chest radiograph dated 10/31/2021 FINDINGS: CTA CHEST FINDINGS Cardiovascular: Limited evaluation of the bilateral pulmonary arteries due to bolus timing and motion degradation. Satisfactory opacification to the lobar level.  Possible tiny subsegmental pulmonary embolism in the medial right lower lobe (series 5/image 126 and 132), although equivocal due to motion degradation. If this reflects a true finding, the clot burden is very small. Although not tailored for evaluation of the thoracic aorta, there is no evidence of thoracic aortic aneurysm or dissection. The heart is normal in size.  No pericardial effusion. Mediastinum/Nodes: No suspicious mediastinal lymphadenopathy. Visualized thyroid is unremarkable. Lungs/Pleura: Evaluation lung parenchyma is constrained by respiratory motion. Within that constraint, there are no suspicious pulmonary nodules. Patchy medial left lower lobe opacity (series 10/image 87), suspicious for pneumonia. No pleural effusion or pneumothorax. Musculoskeletal: Mild degenerative changes of the lower thoracic spine. Review of the MIP images confirms the above findings. CT ABDOMEN and PELVIS FINDINGS Hepatobiliary: Liver is within normal limits. Status post cholecystectomy. No intrahepatic or extrahepatic ductal dilatation. Pancreas: Within normal limits. Spleen: Within normal limits. Adrenals/Urinary Tract: Adrenal glands are within normal limits. Kidneys are within normal limits.  No hydronephrosis. Bladder is within normal limits. Stomach/Bowel: Stomach is within normal limits. No evidence of bowel obstruction. Normal appendix (series 2/image 85). No colonic wall thickening or inflammatory changes. Vascular/Lymphatic: No evidence of abdominal aortic aneurysm. No suspicious abdominopelvic lymphadenopathy. Reproductive: Status post hysterectomy. Bilateral ovaries are within normal limits. Other: No abdominopelvic ascites. No drainable fluid collection/abscess.  No free air. Musculoskeletal: Visualized osseous  structures are within normal limits. Review of the MIP images confirms the above findings. IMPRESSION: Limited evaluation. Possible tiny isolated subsegmental pulmonary embolism in the right lower lobe,  equivocal due to motion degradation. If this reflects a true finding, the clot burden is very small. Left lower lobe pneumonia. Status post cholecystectomy. No drainable fluid collection/abscess. No free air. Electronically Signed   By: Charline Bills M.D.   On: 10/31/2021 21:34   DG Chest Port 1 View  Result Date: 10/31/2021 CLINICAL DATA:  Questionable sepsis - evaluate for abnormality EXAM: PORTABLE CHEST 1 VIEW COMPARISON:  None Available. FINDINGS: Low lung volumes mild/subtle right basilar opacity. No visible pleural effusion or pneumothorax. Cardiomediastinal silhouette is within normal limits. No acute osseous abnormality. IMPRESSION: Low lung volumes mild/subtle right basilar opacity, which could represent atelectasis or early pneumonia. Dedicated PA and lateral radiographs could better assess if clinically warranted. Electronically Signed   By: Feliberto Harts M.D.   On: 10/31/2021 15:30        Scheduled Meds:  apixaban  10 mg Oral BID   Followed by   Melene Muller ON 11/07/2021] apixaban  5 mg Oral BID   escitalopram  20 mg Oral Daily   guaiFENesin  600 mg Oral BID   insulin aspart  0-5 Units Subcutaneous QHS   insulin aspart  0-9 Units Subcutaneous TID WC   levothyroxine  100 mcg Oral Q0600   [START ON 11/03/2021] metFORMIN  500 mg Oral BID WC   pantoprazole  40 mg Oral Daily   Continuous Infusions:  azithromycin 500 mg (11/01/21 2208)   cefTRIAXone (ROCEPHIN)  IV 1 g (11/01/21 1559)     LOS: 1 day    Time spent: 51 minutes spent on chart review, discussion with nursing staff, consultants, updating family and interview/physical exam; more than 50% of that time was spent in counseling and/or coordination of care.    Alvira Philips [place], DO [facility] Available via Epic secure chat 7am-7pm After these hours, please refer to coverage provider listed on [email or web address] 11/02/2021, 11:12 AM

## 2021-11-02 NOTE — Plan of Care (Signed)
Patient is awake in bed with no concerns at this time

## 2021-11-02 NOTE — Discharge Instructions (Addendum)
Information on my medicine - ELIQUIS (apixaban)  This medication education was reviewed with me or my healthcare representative as part of my discharge preparation.  The pharmacist that spoke with me during my hospital stay was:  Rigoberto Noel, Student-PharmD  Why was Eliquis prescribed for you? Eliquis was prescribed to treat blood clots that may have been found in the veins of your legs (deep vein thrombosis) or in your lungs (pulmonary embolism) and to reduce the risk of them occurring again.  What do You need to know about Eliquis ? The starting dose is 10 mg (two 5 mg tablets) taken TWICE daily for the FIRST SEVEN (7) DAYS, then on 11/07/2021  the dose is reduced to ONE 5 mg tablet taken TWICE daily.  Eliquis may be taken with or without food.   Try to take the dose about the same time in the morning and in the evening. If you have difficulty swallowing the tablet whole please discuss with your pharmacist how to take the medication safely.  Take Eliquis exactly as prescribed and DO NOT stop taking Eliquis without talking to the doctor who prescribed the medication.  Stopping may increase your risk of developing a new blood clot.  Refill your prescription before you run out.  After discharge, you should have regular check-up appointments with your healthcare provider that is prescribing your Eliquis.    What do you do if you miss a dose? If a dose of ELIQUIS is not taken at the scheduled time, take it as soon as possible on the same day and twice-daily administration should be resumed. The dose should not be doubled to make up for a missed dose.  Important Safety Information A possible side effect of Eliquis is bleeding. You should call your healthcare provider right away if you experience any of the following: Bleeding from an injury or your nose that does not stop. Unusual colored urine (red or dark brown) or unusual colored stools (red or black). Unusual bruising for unknown  reasons. A serious fall or if you hit your head (even if there is no bleeding).  Some medicines may interact with Eliquis and might increase your risk of bleeding or clotting while on Eliquis. To help avoid this, consult your healthcare provider or pharmacist prior to using any new prescription or non-prescription medications, including herbals, vitamins, non-steroidal anti-inflammatory drugs (NSAIDs) and supplements.  This website has more information on Eliquis (apixaban): http://www.eliquis.com/eliquis/home

## 2021-11-03 LAB — CBC
HCT: 33.4 % — ABNORMAL LOW (ref 36.0–46.0)
Hemoglobin: 10.7 g/dL — ABNORMAL LOW (ref 12.0–15.0)
MCH: 30.8 pg (ref 26.0–34.0)
MCHC: 32 g/dL (ref 30.0–36.0)
MCV: 96.3 fL (ref 80.0–100.0)
Platelets: 189 10*3/uL (ref 150–400)
RBC: 3.47 MIL/uL — ABNORMAL LOW (ref 3.87–5.11)
RDW: 14 % (ref 11.5–15.5)
WBC: 5.5 10*3/uL (ref 4.0–10.5)
nRBC: 0 % (ref 0.0–0.2)

## 2021-11-03 LAB — COMPREHENSIVE METABOLIC PANEL
ALT: 66 U/L — ABNORMAL HIGH (ref 0–44)
AST: 30 U/L (ref 15–41)
Albumin: 2.9 g/dL — ABNORMAL LOW (ref 3.5–5.0)
Alkaline Phosphatase: 71 U/L (ref 38–126)
Anion gap: 6 (ref 5–15)
BUN: 13 mg/dL (ref 6–20)
CO2: 25 mmol/L (ref 22–32)
Calcium: 8.5 mg/dL — ABNORMAL LOW (ref 8.9–10.3)
Chloride: 108 mmol/L (ref 98–111)
Creatinine, Ser: 0.79 mg/dL (ref 0.44–1.00)
GFR, Estimated: >60 mL/min (ref >60)
Glucose, Bld: 105 mg/dL — ABNORMAL HIGH (ref 70–99)
Potassium: 3.8 mmol/L (ref 3.5–5.1)
Sodium: 139 mmol/L (ref 135–145)
Total Bilirubin: 0.6 mg/dL (ref 0.3–1.2)
Total Protein: 6.2 g/dL — ABNORMAL LOW (ref 6.5–8.1)

## 2021-11-03 LAB — CARDIOLIPIN ANTIBODIES, IGG, IGM, IGA
Anticardiolipin IgA: <9 APL U/mL (ref 0–11)
Anticardiolipin IgG: <9 GPL U/mL (ref 0–14)
Anticardiolipin IgM: <9 MPL U/mL (ref 0–12)

## 2021-11-03 LAB — BETA-2-GLYCOPROTEIN I ABS, IGG/M/A
Beta-2 Glyco I IgG: <9 GPI IgG units (ref 0–20)
Beta-2-Glycoprotein I IgA: <9 GPI IgA units (ref 0–25)
Beta-2-Glycoprotein I IgM: <9 GPI IgM units (ref 0–32)

## 2021-11-03 LAB — PROTEIN C, TOTAL: Protein C, Total: 77 % (ref 60–150)

## 2021-11-03 MED ORDER — AZITHROMYCIN 250 MG PO TABS
500.0000 mg | ORAL_TABLET | Freq: Every day | ORAL | Status: DC
  Administered 2021-11-03: 500 mg via ORAL
  Filled 2021-11-03: qty 2

## 2021-11-03 NOTE — Progress Notes (Signed)
Mobility Specialist - Progress Note   11/03/21 1353  Mobility  Activity Ambulated independently in hallway  Level of Assistance Independent after set-up  Assistive Device None  Distance Ambulated (ft) 1250 ft  Activity Response Tolerated well  Mobility Referral Yes  $Mobility charge 1 Mobility   Pt received in bed and agreed for mobility, no c/o pain nor discomfort. Pt returned to bed with all needs met and family in room.   Roderick Pee Mobility Specialist

## 2021-11-03 NOTE — Progress Notes (Signed)
SATURATION QUALIFICATIONS: (This note is used to comply with regulatory documentation for home oxygen)  Patient Saturations on Room Air at Rest = 91%  Patient Saturations on Room Air while Ambulating = 80%  Patient Saturations on 2 Liters of oxygen while Ambulating = 94%  Please briefly explain why patient needs home oxygen: Patient desats to the low 80's on room air when ambulating

## 2021-11-03 NOTE — Progress Notes (Signed)
PROGRESS NOTE    Cheryl Gentry  ZJI:[id number] DOB: 02-Jul-1985 DOA: 10/31/2021 PCP: Etheleen Nicks, NP    Brief Narrative:   Cheryl Gentry is a 36 y.o. female with past medical history significant for anxiety/depression, GERD, hypothyroidism, class III obesity, PCOS, type 2 diabetes, cholelithiasis/cholecystitis status post recent laparoscopic cholecystectomy on 10/26/2021 presented to the ED with complaints of fever, cough, shortness of breath, pleuritic chest pain, and abdominal pain. Patient states she has continued to have fevers since after her surgery.  Initially it was low-grade fevers with temperature 99 F but for the past few days she has had temperature readings as high as 102 F.  Also endorsing cough, shortness of breath, and right-sided pleuritic chest pain.  She had 2 episodes of vomiting over the past few days which she thinks happened after she coughed aggressively.  She is no longer vomiting and is requesting food.  Denies abdominal pain.  Endorsing dysuria/urinary frequency and urgency.  Denies personal history of blood clots but does report family history of clotting disorder (both father and sister).  In the ED, temperature 98.9 F, HR 90, RR 22, BP 121/67, SPO2 88% on room air and placed on 2 L nasal cannula.  WBC 7.9, hemoglobin 12.7, platelet count 198.  Sodium 137, potassium 3.3, chloride 104, CO2 24, glucose 121.  Lipase 29.  AST 103, ALT 143, total bilirubin 1.5.  Lactic acid 1.0.  D-dimer elevated 5.06.  COVID-19 PCR negative.  Influenza A/B PCR negative.  Urinalysis with large leukocytes, negative nitrite, few bacteria, greater than 50 WBCs.  Chest x-ray with low lung volumes, right basilar opacity.  CT angiogram chest with right lower lobe subsegmental pulmonary embolism, left lower lobe pneumonia.  CT abdomen/pelvis s/p cholecystectomy with no drainable fluid collection/abscess, no free air.  Blood cultures x2, urine culture obtained.  Patient was given oral  potassium, started on azithromycin, ceftriaxone, metronidazole and given 2 L LR bolus.  EDP consulted [facility] for admission for further evaluation and treatment of acute hypoxic respiratory failure secondary to pulmonary embolism, commune acquired pneumonia and UTI.  Assessment & Plan:   Acute hypoxemic respiratory failure Etiology likely secondary to commune acquired pneumonia versus PE.  SPO2 84% on room air on admission. --Continue supplemental oxygen, wean as tolerated; currently requiring 2 L nasal cannula --Ambulatory O2 screen daily  Community-acquired pneumonia CT showing evidence of left lower lobe pneumonia.  No signs of sepsis at this time.  COVID and influenza PCR negative. --Blood cultures x2: Pending --Azithromycin 500 mg IV every 24 hours, plan 5-day course --Ceftriaxone 1 g IV every 24 hours, plan 5-day course --Mucinex 600 mg p.o. every 12 hours --Continue supplemental oxygen, maintain SPO2 greater than 92% --Incentive spirometry, flutter valve --Supportive care, antitussives   Acute right lower lobe subsegmental PE D-dimer elevated 5.06.  CTA showing possible tiny isolated subsegmental PE in the right lower lobe.  Patient is endorsing right-sided pleuritic chest pain and D-dimer is elevated.  Risk factors include family history, class III obesity, and recent surgery. Vascular duplex ultrasound bilateral lower extremities negative for DVT. --Antithrombin III, protein C/S, lupus anticoagulant, beta-2 glycoprotein, homocystine, factor V Leiden, prothrombin gene mutation, cardiolipin antibodies: Pending --Started on Eliquis    UTI, ruled out Patient is endorsing urinary symptoms.  UA with large amount of leukocytes and microscopy showing >50 WBCs.  No signs of sepsis.  Urine culture with no growth.    Mild hypokalemia: Resolved Repleted on admission, repeat potassium this morning 3.8.  Mild transaminitis:  Improving Status post recent laparoscopic cholecystectomy and CT  negative for acute hepatobiliary abnormality. --CMP daily  Subconjunctival hemorrhage, right eye --Saline eyedrops as needed   Mild troponin elevation Likely due to demand ischemia.  Troponin mildly elevated but stable and not consistent with ACS.   Anxiety/depression --Continue Lexapro 20 mg p.o. daily, Xanax 0.25 daily prn   GERD --Continue PPI   Hypothyroidism --Continue levothyroxine 100 mcg p.o. daily   PCOS: Continue metformin  Morbid obesity Body mass index is 46.35 kg/m.  Discussed with patient needs for aggressive lifestyle changes/weight loss as this complicates all facets of care.  Outpatient follow-up with PCP.     DVT prophylaxis:  apixaban (ELIQUIS) tablet 10 mg  apixaban (ELIQUIS) tablet 5 mg    Code Status: Full Code Family Communication: Updated family present at bedside this morning.  Disposition Plan:  Level of care: Med-Surg Status is: Inpatient Remains inpatient appropriate because: Continues require supplemental oxygen with desaturation with minimal exertion, IV antibiotics     Consultants:  None  Procedures:  Vascular duplex ultrasound bilateral lower extremities  Antimicrobials:  Azithromycin 10/14>> Ceftriaxone 10/14>> Metronidazole 10/14 - 10/14   Subjective: Patient seen examined at bedside, resting calmly.  Lying in bed.  Family present.  O2 now down to 2 L nasal cannula.  Continues with mild shortness of breath.  Discussed need to go continue use of her incentive spirometry and flutter valve.   No other specific questions or concerns at this time.  Denies headache, no dizziness, no chest pain, no palpitations, no fever/chills/night sweats, no nausea/vomiting/diarrhea, no abdominal pain, no focal weakness, no fatigue, no cough/congestion, no paresthesias.  No acute events overnight per nursing staff.  Objective: Vitals:   11/02/21 2003 11/03/21 0557 11/03/21 1003 11/03/21 1005  BP: 107/68 126/80    Pulse: 83 85 94 (!) 109  Resp: 20  18    Temp: 98 F (36.7 C) 98.5 F (36.9 C)    TempSrc: Oral     SpO2: 96% 96% 97% 95%  Weight:      Height:        Intake/Output Summary (Last 24 hours) at 11/03/2021 1218 Last data filed at 11/03/2021 0900 Gross per 24 hour  Intake 1028.46 ml  Output --  Net 1028.46 ml   Filed Weights   10/31/21 2033  Weight: 122.5 kg    Examination:  Physical Exam: GEN: NAD, alert and oriented x 3, obese HEENT: NCAT, PERRL, EOMI, right eye with notable subconjunctival hemorrhage, otherwise clear, MMM PULM: CTAB w/o wheezes/crackles, normal respiratory effort, on 2 L nasal cannula with SPO2 96% at rest CV: RRR w/o M/G/R GI: abd soft, NTND, NABS, no R/G/M, noted surgical incision Gentry that are healing well with no surrounding fluctuance/erythema MSK: no peripheral edema, muscle strength globally intact 5/5 bilateral upper/lower extremities NEURO: CN II-XII intact, no focal deficits, sensation to light touch intact PSYCH: normal mood/affect Integumentary: Abdominal surgical Gentry as above, otherwise no other concerning rashes/lesions/wounds noted on exposed skin surfaces     Data Reviewed: I have personally reviewed following labs and imaging studies  CBC: Recent Labs  Lab 10/31/21 1537 11/01/21 0319 11/02/21 0426 11/03/21 0551  WBC 7.9 7.0 5.2 5.5  NEUTROABS 5.9  --   --   --   HGB 12.7 10.5* 10.5* 10.7*  HCT 38.8 32.7* 32.9* 33.4*  MCV 93.9 96.2 98.2 96.3  PLT 198 174 184 189   Basic Metabolic Panel: Recent Labs  Lab 10/31/21 1537 10/31/21 2030 11/01/21 0319 11/02/21  1950 11/03/21 0551  NA 137  --  137 140 139  K 3.3*  --  3.8 3.8 3.8  CL 104  --  107 109 108  CO2 24  --  22 25 25   GLUCOSE 121*  --  122* 100* 105*  BUN 10  --  11 14 13   CREATININE 0.90  --  0.71 0.79 0.79  CALCIUM 8.9  --  8.4* 8.1* 8.5*  MG  --  2.1  --  2.3  --    GFR: Estimated Creatinine Clearance: 124.3 mL/min (by C-G formula based on SCr of 0.79 mg/dL). Liver Function Tests: Recent  Labs  Lab 10/31/21 1537 11/01/21 0319 11/02/21 0426 11/03/21 0551  AST 103* 66* 41 30  ALT 143* 110* 83* 66*  ALKPHOS 118 90 74 71  BILITOT 1.5* 0.9 0.6 0.6  PROT 7.5 6.3* 6.1* 6.2*  ALBUMIN 3.6 2.9* 2.9* 2.9*   Recent Labs  Lab 10/31/21 1710  LIPASE 29   No results for input(s): "AMMONIA" in the last 168 hours. Coagulation Profile: Recent Labs  Lab 10/31/21 1537  INR 1.1   Cardiac Enzymes: No results for input(s): "CKTOTAL", "CKMB", "CKMBINDEX", "TROPONINI" in the last 168 hours. BNP (last 3 results) No results for input(s): "PROBNP" in the last 8760 hours. HbA1C: Recent Labs    11/01/21 0319  HGBA1C 5.1   CBG: Recent Labs  Lab 11/01/21 0159 11/01/21 0840 11/01/21 1112  GLUCAP 128* 101* 102*   Lipid Profile: No results for input(s): "CHOL", "HDL", "LDLCALC", "TRIG", "CHOLHDL", "LDLDIRECT" in the last 72 hours. Thyroid Function Tests: No results for input(s): "TSH", "T4TOTAL", "FREET4", "T3FREE", "THYROIDAB" in the last 72 hours. Anemia Panel: No results for input(s): "VITAMINB12", "FOLATE", "FERRITIN", "TIBC", "IRON", "RETICCTPCT" in the last 72 hours. Sepsis Labs: Recent Labs  Lab 10/31/21 1537 10/31/21 1651  LATICACIDVEN 1.0 1.0    Recent Results (from the past 240 hour(s))  Resp Panel by RT-PCR (Flu A&B, Covid) Anterior Nasal Swab     Status: None   Collection Time: 10/31/21  5:12 PM   Specimen: Anterior Nasal Swab  Result Value Ref Range Status   SARS Coronavirus 2 by RT PCR NEGATIVE NEGATIVE Final    Comment: (NOTE) SARS-CoV-2 target nucleic acids are NOT DETECTED.  The SARS-CoV-2 RNA is generally detectable in upper respiratory specimens during the acute phase of infection. The lowest concentration of SARS-CoV-2 viral copies this assay can detect is 138 copies/mL. A negative result does not preclude SARS-Cov-2 infection and should not be used as the sole basis for treatment or other patient management decisions. A negative result may occur  with  improper specimen collection/handling, submission of specimen other than nasopharyngeal swab, presence of viral mutation(s) within the areas targeted by this assay, and inadequate number of viral copies(<138 copies/mL). A negative result must be combined with clinical observations, patient history, and epidemiological information. The expected result is Negative.  Fact Sheet for Patients:  11/02/21  Fact Sheet for Healthcare Providers:  11/02/21  This test is no t yet approved or cleared by the [employer] FDA and  has been authorized for detection and/or diagnosis of SARS-CoV-2 by FDA under an Emergency Use Authorization (EUA). This EUA will remain  in effect (meaning this test can be used) for the duration of the COVID-19 declaration under Section 564(b)(1) of the Act, 21 U.S.C.section 360bbb-3(b)(1), unless the authorization is terminated  or revoked sooner.       Influenza A by PCR NEGATIVE NEGATIVE Final  Influenza B by PCR NEGATIVE NEGATIVE Final    Comment: (NOTE) The Xpert Xpress SARS-CoV-2/FLU/RSV plus assay is intended as an aid in the diagnosis of influenza from Nasopharyngeal swab specimens and should not be used as a sole basis for treatment. Nasal washings and aspirates are unacceptable for Xpert Xpress SARS-CoV-2/FLU/RSV testing.  Fact Sheet for Patients: EntrepreneurPulse.com.au  Fact Sheet for Healthcare Providers: IncredibleEmployment.be  This test is not yet approved or cleared by the [place] FDA and has been authorized for detection and/or diagnosis of SARS-CoV-2 by FDA under an Emergency Use Authorization (EUA). This EUA will remain in effect (meaning this test can be used) for the duration of the COVID-19 declaration under Section 564(b)(1) of the Act, 21 U.S.C. section 360bbb-3(b)(1), unless the authorization is terminated  or revoked.  Performed at [facility], [place] [address], [place], [place] [address]   Urine Culture     Status: None   Collection Time: 10/31/21  8:38 PM   Specimen: Urine, Clean Catch  Result Value Ref Range Status   Specimen Description   Final    URINE, CLEAN CATCH Performed at [facility], [place] [address], [place], [place] [address]    Special Requests   Final    NONE Performed at [facility], [place] [address], [place], [place] [address]    Culture   Final    NO GROWTH Performed at [facility], [place] [address] [place], [place] [address]    Report Status 11/02/2021 FINAL  Final         Radiology Studies: No results found.      Scheduled Meds:  apixaban  10 mg Oral BID   Followed by   Derrill Memo ON 11/07/2021] apixaban  5 mg Oral BID   azithromycin  500 mg Oral QHS   escitalopram  20 mg Oral Daily   guaiFENesin  600 mg Oral BID   levothyroxine  100 mcg Oral Q0600   metFORMIN  500 mg Oral BID WC   pantoprazole  40 mg Oral Daily   Continuous Infusions:  cefTRIAXone (ROCEPHIN)  IV 1 g (11/02/21 1457)     LOS: 2 days    Time spent: 48 minutes spent on chart review, discussion with nursing staff, consultants, updating family and interview/physical exam; more than 50% of that time was spent in counseling and/or coordination of care.    Catalea Labrecque J [place] DO [facility] Available via Epic secure chat 7am-7pm After these hours, please refer to coverage provider listed on [email or web address] 11/03/2021, 12:18 PM

## 2021-11-03 NOTE — Plan of Care (Signed)
Patient is stable and alert, she was ambulated in the hall, patient did experience some SOB, she was placed on 2 L with ambulation and back to room air when returned to bed.

## 2021-11-03 NOTE — Plan of Care (Signed)

## 2021-11-04 DIAGNOSIS — Z9889 Other specified postprocedural states: Secondary | ICD-10-CM

## 2021-11-04 DIAGNOSIS — R079 Chest pain, unspecified: Secondary | ICD-10-CM

## 2021-11-04 LAB — CBC
HCT: 35.1 % — ABNORMAL LOW (ref 36.0–46.0)
Hemoglobin: 11.4 g/dL — ABNORMAL LOW (ref 12.0–15.0)
MCH: 31.3 pg (ref 26.0–34.0)
MCHC: 32.5 g/dL (ref 30.0–36.0)
MCV: 96.4 fL (ref 80.0–100.0)
Platelets: 185 10*3/uL (ref 150–400)
RBC: 3.64 MIL/uL — ABNORMAL LOW (ref 3.87–5.11)
RDW: 13.8 % (ref 11.5–15.5)
WBC: 7.2 10*3/uL (ref 4.0–10.5)
nRBC: 0 % (ref 0.0–0.2)

## 2021-11-04 LAB — COMPREHENSIVE METABOLIC PANEL
ALT: 56 U/L — ABNORMAL HIGH (ref 0–44)
AST: 32 U/L (ref 15–41)
Albumin: 3.1 g/dL — ABNORMAL LOW (ref 3.5–5.0)
Alkaline Phosphatase: 77 U/L (ref 38–126)
Anion gap: 7 (ref 5–15)
BUN: 8 mg/dL (ref 6–20)
CO2: 23 mmol/L (ref 22–32)
Calcium: 8.5 mg/dL — ABNORMAL LOW (ref 8.9–10.3)
Chloride: 108 mmol/L (ref 98–111)
Creatinine, Ser: 0.79 mg/dL (ref 0.44–1.00)
GFR, Estimated: >60 mL/min (ref >60)
Glucose, Bld: 110 mg/dL — ABNORMAL HIGH (ref 70–99)
Potassium: 3.6 mmol/L (ref 3.5–5.1)
Sodium: 138 mmol/L (ref 135–145)
Total Bilirubin: 0.6 mg/dL (ref 0.3–1.2)
Total Protein: 6.4 g/dL — ABNORMAL LOW (ref 6.5–8.1)

## 2021-11-04 MED ORDER — APIXABAN (ELIQUIS) VTE STARTER PACK (10MG AND 5MG): ORAL_TABLET | ORAL | 0 refills | Status: AC

## 2021-11-04 MED ORDER — GUAIFENESIN-DM 100-10 MG/5ML PO SYRP: 5.0000 mL | ORAL_SOLUTION | ORAL | 0 refills | Status: AC | PRN

## 2021-11-04 NOTE — TOC Transition Note (Signed)
Transition of Care [facility]) - CM/SW Discharge Note   Patient Details  Name: Cheryl Gentry MRN: [id number] Date of Birth: 10-25-1985  [facility] CM/SW Contact:  Vassie Moselle, LCSW Phone Number: 11/04/2021, 11:21 AM   Clinical Narrative:    Pt has new O2 requirement. Pt sats at 88% on RA while ambulating. Home O2 has been ordered through [employer] and DME will be provided to pt prior to discharge.    Final next level of care: Home/Self Care Barriers to Discharge: Barriers Resolved   Patient Goals and CMS Choice Patient states their goals for this hospitalization and ongoing recovery are:: Unable to obtain CMS Medicare.gov Compare Post Acute Care list provided to:: Patient Choice offered to / list presented to : Patient  Discharge Placement                       Discharge Plan and Services In-house Referral: NA Discharge Planning Services: NA            DME Arranged: Oxygen DME Agency: [employer] Date DME Agency Contacted: 11/04/21 Time DME Agency Contacted: [phone] Representative spoke with at DME Agency: Andee Poles            Social Determinants of Health ([place]) Interventions Food Insecurity Interventions: Intervention Not Indicated Housing Interventions: Intervention Not Indicated Transportation Interventions: Intervention Not Indicated Utilities Interventions: Intervention Not Indicated   Readmission Risk Interventions    11/04/2021   11:20 AM 11/02/2021   12:27 PM  Readmission Risk Prevention Plan  Post Dischage Appt Complete Complete  Medication Screening Complete Complete  Transportation Screening Complete Complete

## 2021-11-04 NOTE — Discharge Summary (Signed)
Physician Discharge Summary   Patient: Cheryl Gentry MRN: [id number] DOB: 07-22-1985  Admit date:     10/31/2021  Discharge date: 11/04/21  Discharge Physician: Flora Lipps   PCP: Verita Lamb, NP   Recommendations at discharge:   Follow-up with your primary care provider in 1 week.  Check CBC, BMP, LFT in the next visit. Patient has been prescribed oxygen 1 L/min due to hypoxia on ambulation.  Please reassess in the next visit. Patient has been prescribed Eliquis for anticoagulation for pulmonary embolism.  Hypercoagulable work-up was sent from the hospital which is pending at this time.  Discharge Diagnoses: Principal Problem:   CAP (community acquired pneumonia) Active Problems:   Acute pulmonary embolism ([place])   Acute hypoxemic respiratory failure (HCC)   UTI (urinary tract infection)   Hypokalemia   Transaminitis   Type 2 diabetes mellitus (HCC)   Hypothyroidism   Acute on chronic respiratory failure with hypoxia (HCC)  Resolved Problems:   * No resolved hospital problems. *  Hospital Course: Cheryl Gentry is a 36 y.o. female with past medical history significant for anxiety/depression, GERD, hypothyroidism, obesity, PCOS, type 2 diabetes, cholelithiasis/cholecystitis status post recent laparoscopic cholecystectomy on 10/26/2021 presented to the ED with complaints of fever, cough, shortness of breath, chest pain and abdominal pain.  She reported fevers after surgery up to 102 at home with cough shortness of breath and pleuritic chest pain.  He also had vomiting.  In the ED patient was hypoxic with pulse ox of 88% on room air and was put on 2 L of nasal cannula oxygen.  Labs showed hypokalemia at 3.3.  Lipase was 29.  ALT AST elevated with total bilirubin.  D-dimer was elevated at 5.0.   COVID-19 PCR negative.  Influenza PCR negative.  Urinalysis with large leukocytes, negative nitrite, few bacteria, greater than 50 WBCs.  Chest x-ray with low lung volumes, right  basilar opacity.  Patient then underwent CT angiogram chest with findings of right lower lobe subsegmental pulmonary embolism, left lower lobe pneumonia.  CT abdomen/pelvis s/p cholecystectomy with no drainable fluid collection/abscess, no free air.  Patient was then admitted to the hospital for further evaluation and treatment.  Following conditions were addressed during hospitalization.:   Acute hypoxemic respiratory failure Likely secondary to pneumonia and pulmonary embolism, patient still is hypoxic on ambulation with pulse ox of 88% on room air.  Has qualified for oxygen and will be given on discharge   community-acquired pneumonia CT showing evidence of left lower lobe pneumonia.  No sepsis.  COVID and influenza was negative.  Blood cultures pending.  Urine culture negative.  Completed Rocephin and Zithromax 5-day course.     Acute right lower lobe subsegmental PE D-dimer was elevated 5.06.  CTA showing tiny isolated subsegmental PE in the right lower lobe.  Hypercoagulable work-up-sent.  Continue Eliquis. .  UTI, ruled out  UA with large amount of leukocytes and microscopy showing >50 WBCs.  No signs of sepsis.  Urine culture with no growth.  She has received antibiotics for pneumonia.   Mild hypokalemia: Resolved  Elevated LFTs improving.  Status post recent laparoscopic cholecystectomy and CT negative for acute hepatobiliary abnormality.   Subconjunctival hemorrhage, right eye --Saline eyedrops    Mild troponin elevation Likely due to demand ischemia.  No chest pain or other signs of acute coronary syndrome.  Anxiety/depression --Continue Lexapro and Xanax   GERD Continue PPI from home   Hypothyroidism Continue Synthroid    PCOS: Continue metformin  Morbid obesity Body mass index is 46.35 kg/m.  Would benefit from weight loss as outpatient.    Consultants: None Procedures performed: None Disposition: Home.  Spoke with the patient's husband at bedside  Diet  recommendation:  Discharge Diet Orders (From admission, onward)     Start     Ordered   11/04/21 0000  Diet general        11/04/21 1125           Regular diet DISCHARGE MEDICATION: Allergies as of 11/04/2021       Reactions   Shellfish Allergy Hives, Swelling   Iodine Hives, Swelling        Medication List     STOP taking these medications    azithromycin 1 g powder Commonly known as: ZITHROMAX   HYDROcodone-acetaminophen 5-325 MG tablet Commonly known as: NORCO/VICODIN       TAKE these medications    ALPRAZolam 0.25 MG tablet Commonly known as: XANAX Take 0.25 mg by mouth daily as needed for anxiety.   Apixaban Starter Pack (10mg  and 5mg ) Commonly known as: ELIQUIS STARTER PACK Take as directed on package: start with two-5mg  tablets twice daily for 7 days. On day 8, switch to one-5mg  tablet twice daily.   benzonatate 100 MG capsule Commonly known as: TESSALON Take 100 mg by mouth 3 (three) times daily.   escitalopram 20 MG tablet Commonly known as: LEXAPRO Take 20 mg by mouth daily.   guaiFENesin-dextromethorphan 100-10 MG/5ML syrup Commonly known as: ROBITUSSIN DM Take 5 mLs by mouth every 4 (four) hours as needed for cough.   levothyroxine 100 MCG tablet Commonly known as: SYNTHROID Take 100 mcg by mouth daily.   metFORMIN 500 MG tablet Commonly known as: GLUCOPHAGE Take 500 mg by mouth 2 (two) times daily.   omeprazole 20 MG tablet Commonly known as: PRILOSEC OTC Take 20 mg by mouth daily.               Durable Medical Equipment  (From admission, onward)           Start     Ordered   11/04/21 1121  For home use only DME oxygen  Once       Question Answer Comment  Length of Need 6 Months   Mode or (Route) Nasal cannula   Liters per Minute 1   Frequency Continuous (stationary and portable oxygen unit needed)   Oxygen conserving device Yes   Oxygen delivery system Gas      11/04/21 1120           Subjective.    Today, patient was seen and examined at bedside.  Feels better.  Patient was able to ambulate okay but had some hypoxia on ambulation.  No chest discomfort and cough.  Discharge Exam: Filed Weights   10/31/21 2033  Weight: 122.5 kg  Body mass index is 46.35 kg/m.     11/04/2021    5:25 AM 11/03/2021    8:19 PM 11/03/2021    1:12 PM  Vitals with BMI  Systolic [id number]  Diastolic 73 81 74  Pulse 89 96 97    General: Morbidly obese built, not in obvious distress HENT:   No scleral pallor or icterus noted. Oral mucosa is moist.  Chest:    Diminished breath sounds bilaterally. No crackles or wheezes.  CVS: S1 &S2 heard. No murmur.  Regular rate and rhythm. Abdomen: Soft, nontender, nondistended.  Bowel sounds are heard.  Surgical sites are healing Extremities: No  cyanosis, clubbing or edema.  Peripheral pulses are palpable. Psych: Alert, awake and oriented, normal mood CNS:  No cranial nerve deficits.  Power equal in all extremities.   Skin: Warm and dry.  No rashes noted.   Condition at discharge: good  The results of significant diagnostics from this hospitalization (including imaging, microbiology, ancillary and laboratory) are listed below for reference.   Imaging Studies: VAS [place] LOWER EXTREMITY VENOUS (DVT)  Result Date: 11/01/2021  Lower Venous DVT Study Patient Name:  LORITA SURI  Date of Exam:   11/01/2021 Medical Rec #: [id number]         Accession #:    [id number] Date of Birth: 06/01/1985         Patient Gender: F Patient Age:   63 years Exam Location:  [facility] Procedure:      VAS [place] LOWER EXTREMITY VENOUS (DVT) Referring Phys: Wandra Feinstein RATHORE --------------------------------------------------------------------------------  Indications: Pulmonary embolism.  Risk Factors: Surgery Cholecystectomy 10/26/21. Comparison Study: No previous exams Performing Technologist: Jody Hill RVT, RDMS  Examination Guidelines: A complete evaluation includes B-mode  imaging, spectral Doppler, color Doppler, and power Doppler as needed of all accessible portions of each vessel. Bilateral testing is considered an integral part of a complete examination. Limited examinations for reoccurring indications may be performed as noted. The reflux portion of the exam is performed with the patient in reverse Trendelenburg.  +---------+---------------+---------+-----------+----------+--------------+ RIGHT    CompressibilityPhasicitySpontaneityPropertiesThrombus Aging +---------+---------------+---------+-----------+----------+--------------+ CFV      Full           Yes      Yes                                 +---------+---------------+---------+-----------+----------+--------------+ SFJ      Full                                                        +---------+---------------+---------+-----------+----------+--------------+ FV Prox  Full           Yes      Yes                                 +---------+---------------+---------+-----------+----------+--------------+ FV Mid   Full           Yes      Yes                                 +---------+---------------+---------+-----------+----------+--------------+ FV DistalFull           Yes      Yes                                 +---------+---------------+---------+-----------+----------+--------------+ PFV      Full                                                        +---------+---------------+---------+-----------+----------+--------------+ POP      Full  Yes      Yes                                 +---------+---------------+---------+-----------+----------+--------------+ PTV      Full                                                        +---------+---------------+---------+-----------+----------+--------------+ PERO     Full                                                        +---------+---------------+---------+-----------+----------+--------------+    +---------+---------------+---------+-----------+----------+--------------+ LEFT     CompressibilityPhasicitySpontaneityPropertiesThrombus Aging +---------+---------------+---------+-----------+----------+--------------+ CFV      Full           Yes      Yes                                 +---------+---------------+---------+-----------+----------+--------------+ SFJ      Full                                                        +---------+---------------+---------+-----------+----------+--------------+ FV Prox  Full           Yes      Yes                                 +---------+---------------+---------+-----------+----------+--------------+ FV Mid   Full           Yes      Yes                                 +---------+---------------+---------+-----------+----------+--------------+ FV DistalFull           Yes      Yes                                 +---------+---------------+---------+-----------+----------+--------------+ PFV      Full                                                        +---------+---------------+---------+-----------+----------+--------------+ POP      Full           Yes      Yes                                 +---------+---------------+---------+-----------+----------+--------------+ PTV      Full                                                        +---------+---------------+---------+-----------+----------+--------------+  PERO     Full                                                        +---------+---------------+---------+-----------+----------+--------------+     Summary: BILATERAL: - No evidence of deep vein thrombosis seen in the lower extremities, bilaterally. -No evidence of popliteal cyst, bilaterally.   *See table(s) above for measurements and observations. Electronically signed by Monica Martinez MD on 11/01/2021 at 2:04:14 PM.    Final    CT Angio Chest PE W and/or Wo Contrast  Result Date:  10/31/2021 CLINICAL DATA:  Five days status post cholecystectomy, fevers, abdominal pain. Evaluate for PE. EXAM: CT ANGIOGRAPHY CHEST CT ABDOMEN AND PELVIS WITH CONTRAST TECHNIQUE: Multidetector CT imaging of the chest was performed using the standard protocol during bolus administration of intravenous contrast. Multiplanar CT image reconstructions and MIPs were obtained to evaluate the vascular anatomy. Multidetector CT imaging of the abdomen and pelvis was performed using the standard protocol during bolus administration of intravenous contrast. RADIATION DOSE REDUCTION: This exam was performed according to the departmental dose-optimization program which includes automated exposure control, adjustment of the mA and/or kV according to patient size and/or use of iterative reconstruction technique. CONTRAST:  125mL OMNIPAQUE IOHEXOL 350 MG/ML SOLN COMPARISON:  Chest radiograph dated 10/31/2021 FINDINGS: CTA CHEST FINDINGS Cardiovascular: Limited evaluation of the bilateral pulmonary arteries due to bolus timing and motion degradation. Satisfactory opacification to the lobar level. Possible tiny subsegmental pulmonary embolism in the medial right lower lobe (series 5/image 126 and 132), although equivocal due to motion degradation. If this reflects a true finding, the clot burden is very small. Although not tailored for evaluation of the thoracic aorta, there is no evidence of thoracic aortic aneurysm or dissection. The heart is normal in size.  No pericardial effusion. Mediastinum/Nodes: No suspicious mediastinal lymphadenopathy. Visualized thyroid is unremarkable. Lungs/Pleura: Evaluation lung parenchyma is constrained by respiratory motion. Within that constraint, there are no suspicious pulmonary nodules. Patchy medial left lower lobe opacity (series 10/image 87), suspicious for pneumonia. No pleural effusion or pneumothorax. Musculoskeletal: Mild degenerative changes of the lower thoracic spine. Review of the  MIP images confirms the above findings. CT ABDOMEN and PELVIS FINDINGS Hepatobiliary: Liver is within normal limits. Status post cholecystectomy. No intrahepatic or extrahepatic ductal dilatation. Pancreas: Within normal limits. Spleen: Within normal limits. Adrenals/Urinary Tract: Adrenal glands are within normal limits. Kidneys are within normal limits.  No hydronephrosis. Bladder is within normal limits. Stomach/Bowel: Stomach is within normal limits. No evidence of bowel obstruction. Normal appendix (series 2/image 85). No colonic wall thickening or inflammatory changes. Vascular/Lymphatic: No evidence of abdominal aortic aneurysm. No suspicious abdominopelvic lymphadenopathy. Reproductive: Status post hysterectomy. Bilateral ovaries are within normal limits. Other: No abdominopelvic ascites. No drainable fluid collection/abscess.  No free air. Musculoskeletal: Visualized osseous structures are within normal limits. Review of the MIP images confirms the above findings. IMPRESSION: Limited evaluation. Possible tiny isolated subsegmental pulmonary embolism in the right lower lobe, equivocal due to motion degradation. If this reflects a true finding, the clot burden is very small. Left lower lobe pneumonia. Status post cholecystectomy. No drainable fluid collection/abscess. No free air. Electronically Signed   By: Julian Hy M.D.   On: 10/31/2021 21:34   CT ABDOMEN PELVIS W CONTRAST  Result Date: 10/31/2021 CLINICAL DATA:  Five days status  post cholecystectomy, fevers, abdominal pain. Evaluate for PE. EXAM: CT ANGIOGRAPHY CHEST CT ABDOMEN AND PELVIS WITH CONTRAST TECHNIQUE: Multidetector CT imaging of the chest was performed using the standard protocol during bolus administration of intravenous contrast. Multiplanar CT image reconstructions and MIPs were obtained to evaluate the vascular anatomy. Multidetector CT imaging of the abdomen and pelvis was performed using the standard protocol during bolus  administration of intravenous contrast. RADIATION DOSE REDUCTION: This exam was performed according to the departmental dose-optimization program which includes automated exposure control, adjustment of the mA and/or kV according to patient size and/or use of iterative reconstruction technique. CONTRAST:  136mL OMNIPAQUE IOHEXOL 350 MG/ML SOLN COMPARISON:  Chest radiograph dated 10/31/2021 FINDINGS: CTA CHEST FINDINGS Cardiovascular: Limited evaluation of the bilateral pulmonary arteries due to bolus timing and motion degradation. Satisfactory opacification to the lobar level. Possible tiny subsegmental pulmonary embolism in the medial right lower lobe (series 5/image 126 and 132), although equivocal due to motion degradation. If this reflects a true finding, the clot burden is very small. Although not tailored for evaluation of the thoracic aorta, there is no evidence of thoracic aortic aneurysm or dissection. The heart is normal in size.  No pericardial effusion. Mediastinum/Nodes: No suspicious mediastinal lymphadenopathy. Visualized thyroid is unremarkable. Lungs/Pleura: Evaluation lung parenchyma is constrained by respiratory motion. Within that constraint, there are no suspicious pulmonary nodules. Patchy medial left lower lobe opacity (series 10/image 87), suspicious for pneumonia. No pleural effusion or pneumothorax. Musculoskeletal: Mild degenerative changes of the lower thoracic spine. Review of the MIP images confirms the above findings. CT ABDOMEN and PELVIS FINDINGS Hepatobiliary: Liver is within normal limits. Status post cholecystectomy. No intrahepatic or extrahepatic ductal dilatation. Pancreas: Within normal limits. Spleen: Within normal limits. Adrenals/Urinary Tract: Adrenal glands are within normal limits. Kidneys are within normal limits.  No hydronephrosis. Bladder is within normal limits. Stomach/Bowel: Stomach is within normal limits. No evidence of bowel obstruction. Normal appendix (series  2/image 85). No colonic wall thickening or inflammatory changes. Vascular/Lymphatic: No evidence of abdominal aortic aneurysm. No suspicious abdominopelvic lymphadenopathy. Reproductive: Status post hysterectomy. Bilateral ovaries are within normal limits. Other: No abdominopelvic ascites. No drainable fluid collection/abscess.  No free air. Musculoskeletal: Visualized osseous structures are within normal limits. Review of the MIP images confirms the above findings. IMPRESSION: Limited evaluation. Possible tiny isolated subsegmental pulmonary embolism in the right lower lobe, equivocal due to motion degradation. If this reflects a true finding, the clot burden is very small. Left lower lobe pneumonia. Status post cholecystectomy. No drainable fluid collection/abscess. No free air. Electronically Signed   By: Julian Hy M.D.   On: 10/31/2021 21:34   DG Chest Port 1 View  Result Date: 10/31/2021 CLINICAL DATA:  Questionable sepsis - evaluate for abnormality EXAM: PORTABLE CHEST 1 VIEW COMPARISON:  None Available. FINDINGS: Low lung volumes mild/subtle right basilar opacity. No visible pleural effusion or pneumothorax. Cardiomediastinal silhouette is within normal limits. No acute osseous abnormality. IMPRESSION: Low lung volumes mild/subtle right basilar opacity, which could represent atelectasis or early pneumonia. Dedicated PA and lateral radiographs could better assess if clinically warranted. Electronically Signed   By: Margaretha Sheffield M.D.   On: 10/31/2021 15:30    Microbiology: Results for orders placed or performed during the hospital encounter of 10/31/21  Resp Panel by RT-PCR (Flu A&B, Covid) Anterior Nasal Swab     Status: None   Collection Time: 10/31/21  5:12 PM   Specimen: Anterior Nasal Swab  Result Value Ref Range Status   SARS  Coronavirus 2 by RT PCR NEGATIVE NEGATIVE Final    Comment: (NOTE) SARS-CoV-2 target nucleic acids are NOT DETECTED.  The SARS-CoV-2 RNA is generally  detectable in upper respiratory specimens during the acute phase of infection. The lowest concentration of SARS-CoV-2 viral copies this assay can detect is 138 copies/mL. A negative result does not preclude SARS-Cov-2 infection and should not be used as the sole basis for treatment or other patient management decisions. A negative result may occur with  improper specimen collection/handling, submission of specimen other than nasopharyngeal swab, presence of viral mutation(s) within the areas targeted by this assay, and inadequate number of viral copies(<138 copies/mL). A negative result must be combined with clinical observations, patient history, and epidemiological information. The expected result is Negative.  Fact Sheet for Patients:  EntrepreneurPulse.com.au  Fact Sheet for Healthcare Providers:  IncredibleEmployment.be  This test is no t yet approved or cleared by the [place] FDA and  has been authorized for detection and/or diagnosis of SARS-CoV-2 by FDA under an Emergency Use Authorization (EUA). This EUA will remain  in effect (meaning this test can be used) for the duration of the COVID-19 declaration under Section 564(b)(1) of the Act, 21 U.S.C.section 360bbb-3(b)(1), unless the authorization is terminated  or revoked sooner.       Influenza A by PCR NEGATIVE NEGATIVE Final   Influenza B by PCR NEGATIVE NEGATIVE Final    Comment: (NOTE) The Xpert Xpress SARS-CoV-2/FLU/RSV plus assay is intended as an aid in the diagnosis of influenza from Nasopharyngeal swab specimens and should not be used as a sole basis for treatment. Nasal washings and aspirates are unacceptable for Xpert Xpress SARS-CoV-2/FLU/RSV testing.  Fact Sheet for Patients: EntrepreneurPulse.com.au  Fact Sheet for Healthcare Providers: IncredibleEmployment.be  This test is not yet approved or cleared by the [place] FDA  and has been authorized for detection and/or diagnosis of SARS-CoV-2 by FDA under an Emergency Use Authorization (EUA). This EUA will remain in effect (meaning this test can be used) for the duration of the COVID-19 declaration under Section 564(b)(1) of the Act, 21 U.S.C. section 360bbb-3(b)(1), unless the authorization is terminated or revoked.  Performed at [facility], [place] [address], [place], [place] [address]   Urine Culture     Status: None   Collection Time: 10/31/21  8:38 PM   Specimen: Urine, Clean Catch  Result Value Ref Range Status   Specimen Description   Final    URINE, CLEAN CATCH Performed at [facility], [place] [address], [place], [place] [address]    Special Requests   Final    NONE Performed at [facility], [place] [address] [place], [place] [address]    Culture   Final    NO GROWTH Performed at [facility], [place] [address], [place], [place] [address]    Report Status 11/02/2021 FINAL  Final    Labs: CBC: Recent Labs  Lab 10/31/21 1537 11/01/21 0319 11/02/21 0426 11/03/21 0551 11/04/21 0544  WBC 7.9 7.0 5.2 5.5 7.2  NEUTROABS 5.9  --   --   --   --   HGB 12.7 10.5* 10.5* 10.7* 11.4*  HCT 38.8 32.7* 32.9* 33.4* 35.1*  MCV 93.9 96.2 98.2 96.3 96.4  PLT 198 174 184 189 [id number]   Basic Metabolic Panel: Recent Labs  Lab 10/31/21 1537 10/31/21 2030 11/01/21 0319 11/02/21 0426 11/03/21 0551 11/04/21 0544  NA 137  --  137 140 139 138  K 3.3*  --  3.8 3.8 3.8 3.6  CL 104  --  107 109 108 108  CO2 24  --  22 25 25 23   GLUCOSE 121*  --  122* 100* 105* 110*  BUN 10  --  11 14 13 8   CREATININE 0.90  --  0.71 0.79 0.79 0.79  CALCIUM 8.9  --  8.4* 8.1* 8.5* 8.5*  MG  --  2.1  --  2.3  --   --    Liver Function Tests: Recent Labs  Lab 10/31/21 1537 11/01/21 0319 11/02/21 0426 11/03/21 0551 11/04/21 0544  AST 103* 66* 41 30 32  ALT 143* 110* 83* 66* 56*  ALKPHOS 118 90 74 71 77   BILITOT 1.5* 0.9 0.6 0.6 0.6  PROT 7.5 6.3* 6.1* 6.2* 6.4*  ALBUMIN 3.6 2.9* 2.9* 2.9* 3.1*   CBG: Recent Labs  Lab 11/01/21 0159 11/01/21 0840 11/01/21 1112  GLUCAP 128* 101* 102*    Discharge time spent: greater than 30 minutes.  Signed: Flora Lipps, MD [facility] 11/04/2021

## 2021-11-04 NOTE — Progress Notes (Signed)
Mobility Specialist - Progress Note   11/04/21 0925  Mobility  Activity Ambulated with assistance in hallway  Level of Assistance Independent after set-up  Assistive Device None  Distance Ambulated (ft) 1500 ft  Activity Response Tolerated well  Mobility Referral Yes  $Mobility charge 1 Mobility   Nurse requested Mobility Specialist to perform oxygen saturation test with pt which includes removing pt from oxygen both at rest and while ambulating.  Below are the results from that testing.     Patient Saturations on Room Air at Rest = spO2 93% Patient Saturations on Room Air while Ambulating = sp02 88% .  Rested and performed pursed lip breathing for 1 minute with sp02 at 92%. Patient Saturations on 1 Liter of oxygen while Ambulating = sp02 95% At end of testing, pt left on RA. Reported results to nurse.   Pt received in bed and agreed to testing, no c/o pain nor discomfort. Pt back to bed with all needs met.  Roderick Pee Mobility Specialist

## 2021-11-04 NOTE — Hospital Course (Signed)
Cheryl Gentry is a 36 y.o. female with past medical history significant for anxiety/depression, GERD, hypothyroidism, obesity, PCOS, type 2 diabetes, cholelithiasis/cholecystitis status post recent laparoscopic cholecystectomy on 10/26/2021 presented to the ED with complaints of fever, cough, shortness of breath, chest pain and abdominal pain.  She reported fevers after surgery up to 102 at home with cough shortness of breath and pleuritic chest pain.  He also had vomiting.  In the ED patient was hypoxic with pulse ox of 88% on room air and was put on 2 L of nasal cannula oxygen.  Labs showed hypokalemia at 3.3.  Lipase was 29.  ALT AST elevated with total bilirubin.  D-dimer was elevated at 5.0.   COVID-19 PCR negative.  Influenza PCR negative.  Urinalysis with large leukocytes, negative nitrite, few bacteria, greater than 50 WBCs.  Chest x-ray with low lung volumes, right basilar opacity.  Patient then underwent CT angiogram chest with findings of right lower lobe subsegmental pulmonary embolism, left lower lobe pneumonia.  CT abdomen/pelvis s/p cholecystectomy with no drainable fluid collection/abscess, no free air.  Patient was then admitted to the hospital for further evaluation and treatment.  Assessment & Plan:   Acute hypoxemic respiratory failure Likely secondary to pneumonia and PE.  Was on 2 L of oxygen.  Currently on room air.    Community-acquired pneumonia CT showing evidence of left lower lobe pneumonia.  No sepsis.  COVID and influenza was negative.  Blood cultures pending.  Urine culture negative.  Received Rocephin and Zithromax 5-day course.  Continue supportive care with-incentive spirometry, flutter valve   Acute right lower lobe subsegmental PE D-dimer was elevated 5.06.  CTA showing tiny isolated subsegmental PE in the right lower lobe.  Hypercoagulable work-up-sent.  Continue Eliquis. .  UTI, ruled out  UA with large amount of leukocytes and microscopy showing >50 WBCs.  No  signs of sepsis.  Urine culture with no growth.  She has received antibiotics for pneumonia.   Mild hypokalemia: Resolved  Elevated LFTs improving.  Status post recent laparoscopic cholecystectomy and CT negative for acute hepatobiliary abnormality.   Subconjunctival hemorrhage, right eye --Saline eyedrops    Mild troponin elevation Likely due to demand ischemia.  No chest pain or other signs of acute coronary syndrome.  Anxiety/depression --Continue Lexapro and Xanax   GERD --Continue PPI   Hypothyroidism Continue Synthroid    PCOS: Continue metformin   Morbid obesity Body mass index is 46.35 kg/m.  Would benefit from weight loss as outpatient.

## 2021-11-04 NOTE — Progress Notes (Signed)
SATURATION QUALIFICATIONS: (This note is used to comply with regulatory documentation for home oxygen)  Patient Saturations on Room Air at Rest = 93%  Patient Saturations on Room Air while Ambulating = 88%  Patient Saturations on 1 Liters of oxygen while Ambulating = 95%  Please briefly explain why patient needs home oxygen: Patients oxygen level drops below 90% with activity and ambulation.

## 2021-11-05 LAB — CULTURE, BLOOD (ROUTINE X 2)
Culture: NO GROWTH
Culture: NO GROWTH

## 2021-11-06 LAB — FACTOR 5 LEIDEN

## 2021-11-09 LAB — PROTHROMBIN GENE MUTATION

## 2021-11-10 ENCOUNTER — Telehealth: Payer: Self-pay | Admitting: Internal Medicine

## 2021-11-11 NOTE — Telephone Encounter (Signed)
Patient recently admitted 10/31/2021-11/04/2021 and was diagnosed with acute right lower lobe subsegmental PE.  She was discharged on Eliquis.  Hypercoagulable work-up came back showing +heterozygous prothrombin gene mutation. I have made multiple attempts but unable to reach the patient over the phone.  Chart reviewed under care everywhere, patient established care with new PCP Dr. Joya San at [facility] yesterday and referral was placed to hematology/oncology for evaluation and to determine the length of anticoagulation.

## 2021-11-18 ENCOUNTER — Other Ambulatory Visit (HOSPITAL_COMMUNITY): Payer: Self-pay | Admitting: Surgery

## 2021-11-18 DIAGNOSIS — R7989 Other specified abnormal findings of blood chemistry: Secondary | ICD-10-CM

## 2021-11-20 ENCOUNTER — Other Ambulatory Visit (HOSPITAL_COMMUNITY): Payer: Self-pay | Admitting: Surgery

## 2021-11-20 ENCOUNTER — Ambulatory Visit (HOSPITAL_COMMUNITY)
Admission: RE | Admit: 2021-11-20 | Discharge: 2021-11-20 | Disposition: A | Discharge status: Home / Self Care | Payer: BC Managed Care – PPO | Source: Ambulatory Visit | Attending: Surgery | Admitting: Surgery

## 2021-11-20 DIAGNOSIS — R7989 Other specified abnormal findings of blood chemistry: Secondary | ICD-10-CM

## 2021-11-20 MED ORDER — GADOBUTROL 1 MMOL/ML IV SOLN
10.0000 mL | Freq: Once | INTRAVENOUS | Status: AC | PRN
  Administered 2021-11-20: 10 mL via INTRAVENOUS
